# Patient Record
Sex: Male | Born: 1960 | Race: White | Hispanic: No | Marital: Married | Smoking: Never smoker
Health system: Southern US, Community
[De-identification: ages and names within clinical notes are randomized; demographics above are authoritative.]

## PROBLEM LIST (undated history)

## (undated) DIAGNOSIS — R748 Abnormal levels of other serum enzymes: Secondary | ICD-10-CM

## (undated) DIAGNOSIS — R7303 Prediabetes: Secondary | ICD-10-CM

## (undated) DIAGNOSIS — F32A Depression, unspecified: Secondary | ICD-10-CM

## (undated) DIAGNOSIS — R030 Elevated blood-pressure reading, without diagnosis of hypertension: Secondary | ICD-10-CM

## (undated) DIAGNOSIS — M1711 Unilateral primary osteoarthritis, right knee: Secondary | ICD-10-CM

## (undated) HISTORY — PX: WRIST SURGERY: SHX841

## (undated) HISTORY — DX: Unilateral primary osteoarthritis, right knee: M17.11

---

## 2001-08-31 ENCOUNTER — Encounter: Payer: Self-pay | Admitting: Family Medicine

## 2001-08-31 ENCOUNTER — Encounter: Admission: RE | Admit: 2001-08-31 | Discharge: 2001-08-31 | Payer: Self-pay | Admitting: Family Medicine

## 2004-06-15 ENCOUNTER — Encounter: Payer: Self-pay | Admitting: Family Medicine

## 2004-07-07 ENCOUNTER — Encounter: Payer: Self-pay | Admitting: Family Medicine

## 2010-05-20 ENCOUNTER — Ambulatory Visit: Payer: Self-pay | Admitting: Family Medicine

## 2010-12-14 ENCOUNTER — Ambulatory Visit: Payer: Self-pay

## 2010-12-18 ENCOUNTER — Ambulatory Visit: Payer: Self-pay

## 2012-10-17 ENCOUNTER — Ambulatory Visit: Payer: Self-pay | Admitting: Family Medicine

## 2013-07-20 ENCOUNTER — Ambulatory Visit: Payer: Self-pay | Admitting: Gastroenterology

## 2013-07-20 HISTORY — PX: COLONOSCOPY: SHX174

## 2013-07-23 LAB — PATHOLOGY REPORT

## 2015-07-21 ENCOUNTER — Other Ambulatory Visit: Payer: Self-pay | Admitting: Family Medicine

## 2015-08-29 ENCOUNTER — Other Ambulatory Visit: Payer: Self-pay | Admitting: Family Medicine

## 2015-10-08 ENCOUNTER — Ambulatory Visit (INDEPENDENT_AMBULATORY_CARE_PROVIDER_SITE_OTHER): Payer: BLUE CROSS/BLUE SHIELD | Admitting: Family Medicine

## 2015-10-08 ENCOUNTER — Encounter: Payer: Self-pay | Admitting: Family Medicine

## 2015-10-08 VITALS — BP 112/80 | HR 74 | Temp 98.6°F | Resp 16 | Ht 71.75 in | Wt 204.2 lb

## 2015-10-08 DIAGNOSIS — M179 Osteoarthritis of knee, unspecified: Secondary | ICD-10-CM | POA: Insufficient documentation

## 2015-10-08 DIAGNOSIS — B349 Viral infection, unspecified: Secondary | ICD-10-CM | POA: Diagnosis not present

## 2015-10-08 DIAGNOSIS — R509 Fever, unspecified: Secondary | ICD-10-CM

## 2015-10-08 DIAGNOSIS — M1710 Unilateral primary osteoarthritis, unspecified knee: Secondary | ICD-10-CM | POA: Insufficient documentation

## 2015-10-08 DIAGNOSIS — F439 Reaction to severe stress, unspecified: Secondary | ICD-10-CM | POA: Insufficient documentation

## 2015-10-08 LAB — POCT INFLUENZA A/B
Influenza A, POC: NEGATIVE
Influenza B, POC: NEGATIVE

## 2015-10-08 NOTE — Progress Notes (Signed)
Subjective:     Patient ID: Trevor Boyd, male   DOB: 12-22-1960, 55 y.o.   MRN: [id number]  HPI  Chief Complaint  Patient presents with  . Sore Throat    Patient comes in office today with concerns of cold/flu like symptoms since Saturday 1/28.Patient reports the following symptoms; sore throat, cough, fever high of 102-103, headache and vomiting. Patient reports that he has taken Ibuprofen and Nightquil with no relief.   Wife is also sick and has been in to see me with negative flu test. States he was feeling better today with left over head congestion-:my head is in a box"- and he went to work. Took ibuprofen 600 mg.this AM, felt nauseous and vomited.No longer nauseous.   Review of Systems  Psychiatric/Behavioral:       States he went down on sertraline to 50 mg.for two weeks then resumed it as he did not feel right. 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       Objective:   Physical Exam  Constitutional: He appears well-developed and well-nourished. No distress.  Ears: T.M's intact without inflammation Throat: no tonsillar enlargement or exudate Neck: no cervical adenopathy Lungs: clear     Assessment:    1. Fever, unspecified fever cause - POCT Influenza A/B  2. Viral syndrome - POCT Influenza A/B    Plan:    Discussed use of otc medication and fluid intake. Work excuse for 2/1-2/4. Stop ibuprofen.

## 2015-10-08 NOTE — Patient Instructions (Addendum)
Discussed use of Mucinex D for congestion and Tylenol for pain. Encourage fluid intake with Gatorade or similar.

## 2015-10-13 ENCOUNTER — Other Ambulatory Visit: Payer: Self-pay | Admitting: Family Medicine

## 2015-10-13 DIAGNOSIS — F439 Reaction to severe stress, unspecified: Secondary | ICD-10-CM

## 2015-10-13 MED ORDER — SERTRALINE HCL 100 MG PO TABS: 100.0000 mg | ORAL_TABLET | Freq: Every day | ORAL | Status: DC

## 2016-07-07 ENCOUNTER — Other Ambulatory Visit: Payer: Self-pay | Admitting: Family Medicine

## 2016-07-07 DIAGNOSIS — F439 Reaction to severe stress, unspecified: Secondary | ICD-10-CM

## 2016-09-01 ENCOUNTER — Encounter: Payer: Self-pay | Admitting: Physician Assistant

## 2016-09-01 ENCOUNTER — Ambulatory Visit (INDEPENDENT_AMBULATORY_CARE_PROVIDER_SITE_OTHER): Payer: BLUE CROSS/BLUE SHIELD | Admitting: Physician Assistant

## 2016-09-01 VITALS — BP 116/82 | HR 82 | Temp 98.2°F | Resp 14 | Wt 213.0 lb

## 2016-09-01 DIAGNOSIS — S060X0A Concussion without loss of consciousness, initial encounter: Secondary | ICD-10-CM | POA: Diagnosis not present

## 2016-09-01 NOTE — Progress Notes (Signed)
Patient: Trevor Boyd Male    DOB: [birth year]   55 y.o.   MRN: [id number] Visit Date: 09/01/2016  Today's Provider: [name], PA-C   Chief Complaint  Patient presents with  . Head Injury   Subjective:    HPI   Pt is a 55 y/o male not on any anticoagulants who is here for a possible concussion. He reports that about 10:00AM yesterday he was taking a bolt off of something and the wrench came back and hit him in the forehead. He does have a laceration in the area about his left eye and a knot. He did not lose consciousness  but does reports he "saw stars". He did not vomit. He remembers all the events that happened to him. He reports that he has had a 8/10 headache currently that medications are not helping and nausea started a few minutes ago while waiting to be pulled back to the exam room. He also states that his vision is blurry with further distances than normal and that while making a schedule on the computer at work today he had trouble concentrating. Denies dizziness. He takes two ibuprofen daily for his back, which has not helped today with his headache. No prior head injury. Does work at [employer] 20-50 lbs routinely.    No Known Allergies   Current Outpatient Prescriptions:  .  sertraline (ZOLOFT) 100 MG tablet, take 1 tablet by mouth once daily, Disp: 90 tablet, Rfl: 1  Review of Systems  Constitutional: Negative.   HENT: Negative.   Eyes: Positive for visual disturbance.  Respiratory: Negative.   Cardiovascular: Negative.   Gastrointestinal: Positive for nausea.  Endocrine: Negative.   Genitourinary: Negative.   Musculoskeletal: Negative.   Skin: Negative.   Neurological: Positive for headaches.  Hematological: Negative.   Psychiatric/Behavioral: Negative.     Social History  Substance Use Topics  . Smoking status: Never Smoker  . Smokeless tobacco: Never Used  . Alcohol use No   Objective:   BP 116/82 (BP Location: Left Arm, Patient  Position: Sitting, Cuff Size: Normal)   Pulse 82   Temp 98.2 F (36.8 C) (Oral)   Resp 14   Wt 213 lb (96.6 kg)   SpO2 97%   BMI 29.09 kg/m   Physical Exam  Constitutional: He is oriented to person, place, and time. He appears well-developed and well-nourished. No distress.  HENT:  Head: Normocephalic. Head is with laceration. Head is without raccoon's eyes, without Battle's sign, without abrasion, without contusion, without right periorbital erythema and without left periorbital erythema.    Right Ear: Hearing, tympanic membrane and external ear normal.  Left Ear: Hearing, tympanic membrane and external ear normal.  Nose: Nose normal.  Mouth/Throat: Uvula is midline and oropharynx is clear and moist.  1 cm linear healing laceration   Eyes: Conjunctivae and EOM are normal. Pupils are equal, round, and reactive to light.  Neck: Neck supple.  Cardiovascular: Normal rate.   Pulmonary/Chest: Effort normal.  Lymphadenopathy:    He has no cervical adenopathy.  Neurological: He is alert and oriented to person, place, and time. He has normal strength and normal reflexes. He displays normal reflexes. No cranial nerve deficit or sensory deficit. He exhibits normal muscle tone. He displays a negative Romberg sign. He displays no seizure activity. Coordination and gait normal. GCS eye subscore is 4. GCS verbal subscore is 5. GCS motor subscore is 6.  Reflex Scores:  Tricep reflexes are 2+ on the right side and 2+ on the left side.      Bicep reflexes are 2+ on the right side and 2+ on the left side.      Brachioradialis reflexes are 2+ on the right side and 2+ on the left side.      Patellar reflexes are 2+ on the right side and 2+ on the left side.      Achilles reflexes are 2+ on the right side and 2+ on the left side. Skin: Skin is warm and dry. He is not diaphoretic.  Psychiatric: He has a normal mood and affect. His behavior is normal.        Assessment & Plan:     1. Concussion  without loss of consciousness, initial encounter  Counseled patients on concussions and post-concussive syndrome. Counseled patient that he should have complete brain rest for the next 48 hours, have provided work note for this. Counseled patient that at this point and time he does not need head imaging as it has an extremely low likelihood of showing a clinically significant brain injury. Counseled patient to slowly reintroduce activity and on second impact syndrome. Stop taking ibuprofen and try Tyelnol for headache. Counseled on return precautions. Offered anti-emetic but patient declines.  Return in about 1 week (around 09/08/2016) for concussion.  The entirety of the information documented in the History of Present Illness, Review of Systems and Physical Exam were personally obtained by me. Portions of this information were initially documented by [place] and reviewed by me for thoroughness and accuracy.    Patient Instructions  Concussion, Adult A concussion is a brain injury. It is caused by:  A hit to the head.  A quick and sudden movement (jolt) of the head or neck. A concussion is usually not life threatening. Even so, it can cause serious problems. If you had a concussion before, you may have concussion-like problems after a hit to your head. Follow these instructions at home: General instructions  Follow your doctor's directions carefully.  Take medicines only as told by your doctor.  Only take medicines your doctor says are safe.  Do not drink alcohol until your doctor says it is okay. Alcohol and some drugs can slow down healing. They can also put you at risk for further injury.  If you are having trouble remembering things, write them down.  Try to do one thing at a time if you get distracted easily. For example, do not watch TV while making dinner.  Talk to your family members or close friends when making important decisions.  Follow up with your doctor as  told.  Watch your symptoms. Tell others to do the same. Serious problems can sometimes happen after a concussion. Older adults are more likely to have these problems.  Tell your teachers, school nurse, school counselor, coach, Product/process development scientist, or work Freight forwarder about your concussion. Tell them about what you can or cannot do. They should watch to see if:  It gets even harder for you to pay attention or concentrate.  It gets even harder for you to remember things or learn new things.  You need more time than normal to finish things.  You become annoyed (irritable) more than before.  You are not able to deal with stress as well.  You have more problems than before.  Rest. Make sure you:  Get plenty of sleep at night.  Go to sleep early.  Go to bed at the  same time every day. Try to wake up at the same time.  Rest during the day.  Take naps when you feel tired.  Limit activities where you have to think a lot or concentrate. These include:  Doing homework.  Doing work related to a job.  Watching TV.  Using the computer. Returning To Your Regular Activities  Return to your normal activities slowly, not all at once. You must give your body and brain enough time to heal.  Do not play sports or do other athletic activities until your doctor says it is okay.  Ask your doctor when you can drive, ride a bicycle, or work other vehicles or machines. Never do these things if you feel dizzy.  Ask your doctor about when you can return to work or school. Preventing Another Concussion  It is very important to avoid another brain injury, especially before you have healed. In rare cases, another injury can lead to permanent brain damage, brain swelling, or death. The risk of this is greatest during the first 7-10 days after your injury. Avoid injuries by:  Wearing a seat belt when riding in a car.  Not drinking too much alcohol.  Avoiding activities that could lead to a second  concussion (such as contact sports).  Wearing a helmet when doing activities like:  Biking.  Skiing.  Skateboarding.  Skating.  Making your home safer by:  Removing things from the floor or stairways that could make you trip.  Using grab bars in bathrooms and handrails by stairs.  Placing non-slip mats on floors and in bathtubs.  Improve lighting in dark areas. Contact a doctor if:  It gets even harder for you to pay attention or concentrate.  It gets even harder for you to remember things or learn new things.  You need more time than normal to finish things.  You become annoyed (irritable) more than before.  You are not able to deal with stress as well.  You have more problems than before.  You have problems keeping your balance.  You are not able to react quickly when you should. Get help if you have any of these problems for more than 2 weeks:  Lasting (chronic) headaches.  Dizziness or trouble balancing.  Feeling sick to your stomach (nausea).  Seeing (vision) problems.  Being affected by noises or light more than normal.  Feeling sad, low, down in the dumps, blue, gloomy, or empty (depressed).  Mood changes (mood swings).  Feeling of fear or nervousness about what may happen (anxiety).  Feeling annoyed.  Memory problems.  Problems concentrating or paying attention.  Sleep problems.  Feeling tired all the time. Get help right away if:  You have bad headaches or your headaches get worse.  You have weakness (even if it is in one hand, leg, or part of the face).  You have loss of feeling (numbness).  You feel off balance.  You keep throwing up (vomiting).  You feel tired.  One black center of your eye (pupil) is larger than the other.  You twitch or shake violently (convulse).  Your speech is not clear (slurred).  You are more confused, easily angered (agitated), or annoyed than before.  You have more trouble resting than  before.  You are unable to recognize people or places.  You have neck pain.  It is difficult to wake you up.  You have unusual behavior changes.  You pass out (lose consciousness). This information is not intended to replace advice  given to you by your health care provider. Make sure you discuss any questions you have with your health care provider. Document Released: 08/11/2009 Document Revised: 01/29/2016 Document Reviewed: 03/15/2013 Elsevier Interactive Patient Education  2017 [place], [facility]

## 2016-09-01 NOTE — Patient Instructions (Signed)
Concussion, Adult ° °A concussion is a brain injury. It is caused by: °A hit to the head. °A quick and sudden movement (jolt) of the head or neck. °A concussion is usually not life threatening. Even so, it can cause serious problems. If you had a concussion before, you may have concussion-like problems after a hit to your head. °Follow these instructions at home: °General instructions °Follow your doctor's directions carefully. °Take medicines only as told by your doctor. °Only take medicines your doctor says are safe. °Do not drink alcohol until your doctor says it is okay. Alcohol and some drugs can slow down healing. They can also put you at risk for further injury. °If you are having trouble remembering things, write them down. °Try to do one thing at a time if you get distracted easily. For example, do not watch TV while making dinner. °Talk to your family members or close friends when making important decisions. °Follow up with your doctor as told. °Watch your symptoms. Tell others to do the same. Serious problems can sometimes happen after a concussion. Older adults are more likely to have these problems. °Tell your teachers, school nurse, school counselor, coach, athletic trainer, or work manager about your concussion. Tell them about what you can or cannot do. They should watch to see if: °It gets even harder for you to pay attention or concentrate. °It gets even harder for you to remember things or learn new things. °You need more time than normal to finish things. °You become annoyed (irritable) more than before. °You are not able to deal with stress as well. °You have more problems than before. °Rest. Make sure you: °Get plenty of sleep at night. °Go to sleep early. °Go to bed at the same time every day. Try to wake up at the same time. °Rest during the day. °Take naps when you feel tired. °Limit activities where you have to think a lot or concentrate. These include: °Doing homework. °Doing work related  to a job. °Watching TV. °Using the computer. °Returning To Your Regular Activities  °Return to your normal activities slowly, not all at once. You must give your body and brain enough time to heal. °Do not play sports or do other athletic activities until your doctor says it is okay. °Ask your doctor when you can drive, ride a bicycle, or work other vehicles or machines. Never do these things if you feel dizzy. °Ask your doctor about when you can return to work or school. °Preventing Another Concussion  °It is very important to avoid another brain injury, especially before you have healed. In rare cases, another injury can lead to permanent brain damage, brain swelling, or death. The risk of this is greatest during the first 7-10 days after your injury. Avoid injuries by: °Wearing a seat belt when riding in a car. °Not drinking too much alcohol. °Avoiding activities that could lead to a second concussion (such as contact sports). °Wearing a helmet when doing activities like: °Biking. °Skiing. °Skateboarding. °Skating. °Making your home safer by: °Removing things from the floor or stairways that could make you trip. °Using grab bars in bathrooms and handrails by stairs. °Placing non-slip mats on floors and in bathtubs. °Improve lighting in dark areas. °Contact a doctor if: °It gets even harder for you to pay attention or concentrate. °It gets even harder for you to remember things or learn new things. °You need more time than normal to finish things. °You become annoyed (irritable) more than before. °You   are not able to deal with stress as well. °You have more problems than before. °You have problems keeping your balance. °You are not able to react quickly when you should. °Get help if you have any of these problems for more than 2 weeks: °Lasting (chronic) headaches. °Dizziness or trouble balancing. °Feeling sick to your stomach (nausea). °Seeing (vision) problems. °Being affected by noises or light more than  normal. °Feeling sad, low, down in the dumps, blue, gloomy, or empty (depressed). °Mood changes (mood swings). °Feeling of fear or nervousness about what may happen (anxiety). °Feeling annoyed. °Memory problems. °Problems concentrating or paying attention. °Sleep problems. °Feeling tired all the time. °Get help right away if: °You have bad headaches or your headaches get worse. °You have weakness (even if it is in one hand, leg, or part of the face). °You have loss of feeling (numbness). °You feel off balance. °You keep throwing up (vomiting). °You feel tired. °One black center of your eye (pupil) is larger than the other. °You twitch or shake violently (convulse). °Your speech is not clear (slurred). °You are more confused, easily angered (agitated), or annoyed than before. °You have more trouble resting than before. °You are unable to recognize people or places. °You have neck pain. °It is difficult to wake you up. °You have unusual behavior changes. °You pass out (lose consciousness). °This information is not intended to replace advice given to you by your health care provider. Make sure you discuss any questions you have with your health care provider. °Document Released: 08/11/2009 Document Revised: 01/29/2016 Document Reviewed: 03/15/2013 °Elsevier Interactive Patient Education © 2017 Elsevier Inc. °  °

## 2016-09-08 ENCOUNTER — Ambulatory Visit: Payer: BLUE CROSS/BLUE SHIELD | Admitting: Physician Assistant

## 2017-03-04 ENCOUNTER — Other Ambulatory Visit: Payer: Self-pay | Admitting: Family Medicine

## 2017-03-04 DIAGNOSIS — F439 Reaction to severe stress, unspecified: Secondary | ICD-10-CM

## 2017-06-24 ENCOUNTER — Telehealth: Payer: Self-pay | Admitting: Family Medicine

## 2017-06-24 ENCOUNTER — Other Ambulatory Visit: Payer: Self-pay | Admitting: Family Medicine

## 2017-06-24 DIAGNOSIS — F439 Reaction to severe stress, unspecified: Secondary | ICD-10-CM

## 2017-06-24 MED ORDER — SERTRALINE HCL 100 MG PO TABS: 100.0000 mg | ORAL_TABLET | Freq: Every day | ORAL | 0 refills | Status: DC

## 2017-06-24 NOTE — Telephone Encounter (Signed)
Pt contacted office for refill request on the following medications:  sertraline (ZOLOFT) 100 MG tablet  [place],  [phone]

## 2017-08-08 ENCOUNTER — Encounter: Payer: Self-pay | Admitting: Family Medicine

## 2017-08-08 ENCOUNTER — Ambulatory Visit (INDEPENDENT_AMBULATORY_CARE_PROVIDER_SITE_OTHER): Payer: BLUE CROSS/BLUE SHIELD | Admitting: Family Medicine

## 2017-08-08 VITALS — BP 130/92 | HR 83 | Temp 98.0°F | Resp 16 | Wt 212.8 lb

## 2017-08-08 DIAGNOSIS — F439 Reaction to severe stress, unspecified: Secondary | ICD-10-CM | POA: Diagnosis not present

## 2017-08-08 DIAGNOSIS — J069 Acute upper respiratory infection, unspecified: Secondary | ICD-10-CM | POA: Diagnosis not present

## 2017-08-08 MED ORDER — HYDROCODONE-HOMATROPINE 5-1.5 MG/5ML PO SYRP: ORAL_SOLUTION | ORAL | 0 refills | Status: DC

## 2017-08-08 NOTE — Progress Notes (Addendum)
Subjective:     Patient ID: Trevor Boyd, male   DOB: [birth year], 56 y.o.   MRN: [id number] Chief Complaint  Patient presents with  . Cough    Patient comes in office today with complaints of cough and congestion since 08/03/17. Patient reports non productive cough, runnys nose, sensitivity to light, headache, sinus pain and wheezing. Patient has tried otc Mucinex, Ibuprofen and Nyquil.    HPI States his sons have been sick as well. 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  Review of Systems     Objective:   Physical Exam  Constitutional: He appears well-developed and well-nourished. No distress.  Ears: T.M's intact without inflammation Throat: no tonsillar enlargement or exudate Neck: no cervical adenopathy Lungs: expiratory wheezes in bilateral lung fields..    Assessment:    1. Viral upper respiratory tract infection - HYDROcodone-homatropine (HYCODAN) 5-1.5 MG/5ML syrup; 5 ml 4-6 hours as needed for cough  Dispense: 120 mL; Refill: 0  2. Situational stress    Plan:   Discussed use of Mucinex D. Suggested slow sertraline taper after the holidays.

## 2017-08-08 NOTE — Patient Instructions (Signed)
Discussed use of Mucinex D. Consider slowly tapering sertraline after the holidays. I am willing to call in the 50 mg.if needed.

## 2017-08-10 ENCOUNTER — Encounter: Payer: Self-pay | Admitting: Physician Assistant

## 2017-08-10 ENCOUNTER — Ambulatory Visit
Admission: RE | Admit: 2017-08-10 | Discharge: 2017-08-10 | Disposition: A | Discharge status: Home / Self Care | Payer: BLUE CROSS/BLUE SHIELD | Source: Ambulatory Visit | Attending: Physician Assistant | Admitting: Physician Assistant

## 2017-08-10 ENCOUNTER — Ambulatory Visit (INDEPENDENT_AMBULATORY_CARE_PROVIDER_SITE_OTHER): Payer: BLUE CROSS/BLUE SHIELD | Admitting: Physician Assistant

## 2017-08-10 VITALS — BP 108/70 | HR 76 | Temp 97.9°F | Resp 16 | Wt 207.0 lb

## 2017-08-10 DIAGNOSIS — R059 Cough, unspecified: Secondary | ICD-10-CM

## 2017-08-10 DIAGNOSIS — R05 Cough: Secondary | ICD-10-CM | POA: Diagnosis not present

## 2017-08-10 DIAGNOSIS — R0989 Other specified symptoms and signs involving the circulatory and respiratory systems: Secondary | ICD-10-CM

## 2017-08-10 DIAGNOSIS — R062 Wheezing: Secondary | ICD-10-CM

## 2017-08-10 MED ORDER — ALBUTEROL SULFATE HFA 108 (90 BASE) MCG/ACT IN AERS: 2.0000 | INHALATION_SPRAY | Freq: Four times a day (QID) | RESPIRATORY_TRACT | 2 refills | Status: DC | PRN

## 2017-08-10 NOTE — Patient Instructions (Signed)

## 2017-08-10 NOTE — Progress Notes (Signed)
[place]  Chief Complaint  Patient presents with  . URI    Follow from yesterday    Subjective:    Patient ID: Trevor Boyd, male    DOB: 01/18/1961, 56 y.o.   MRN: [id number]  Upper Respiratory Infection: Trevor Boyd is a  56 y.o. male symptoms of a URI, possible sinusitis. Symptoms include congestion, cough and plugged sensation in the right ear. Onset of symptoms was 8 days ago, gradually worsening since that time. He also c/o cough, headache for the past 3 days .  He is drinking plenty of fluids. Evaluation to date: seen previously and thought to have a viral URI. Treatment to date: cough suppressants. The treatment has provided minimal relief. He was seen by Mikki Santee 2 days ago and given mucinex and cough syrup. He feels worse today, wheezing, SOB.  Review of Systems  Constitutional: Positive for diaphoresis and fatigue. Negative for activity change, appetite change, chills, fever and unexpected weight change.  HENT: Positive for congestion, sinus pressure and sinus pain. Negative for ear discharge, ear pain, hearing loss, nosebleeds, postnasal drip, rhinorrhea, sneezing, sore throat, tinnitus and trouble swallowing.   Eyes: Negative.   Respiratory: Positive for cough, chest tightness, shortness of breath and wheezing. Negative for apnea, choking and stridor.   Gastrointestinal: Negative.   Musculoskeletal: Negative for neck pain and neck stiffness.  Neurological: Positive for light-headedness and headaches. Negative for dizziness.       Objective:   BP 108/70 (BP Location: Left Arm, Patient Position: Sitting, Cuff Size: Large)   Pulse 76   Temp 97.9 F (36.6 C) (Oral)   Resp 16   Wt 207 lb (93.9 kg)   SpO2 94%   BMI 28.27 kg/m   Patient Active Problem List   Diagnosis Date Noted  . Arthritis of knee, degenerative 10/08/2015  . Situational stress 10/08/2015    Outpatient Encounter Medications as of 08/10/2017  Medication Sig  .  HYDROcodone-homatropine (HYCODAN) 5-1.5 MG/5ML syrup 5 ml 4-6 hours as needed for cough  . sertraline (ZOLOFT) 100 MG tablet Take 1 tablet (100 mg total) by mouth daily.  Marland Kitchen albuterol (PROVENTIL HFA;VENTOLIN HFA) 108 (90 Base) MCG/ACT inhaler Inhale 2 puffs into the lungs every 6 (six) hours as needed for wheezing or shortness of breath.   No facility-administered encounter medications on file as of 08/10/2017.     No Known Allergies     Physical Exam  Constitutional: He is oriented to person, place, and time. He appears well-developed and well-nourished.  Cardiovascular: Normal rate and regular rhythm.  Pulmonary/Chest: Effort normal. He has wheezes.  Diffuse wheezes and rhonchi, slightly increased in RLL.  Neurological: He is alert and oriented to person, place, and time.  Skin: Skin is warm and dry.  Psychiatric: He has a normal mood and affect. His behavior is normal.       Assessment & Plan:  1. Wheezes  Oxygen between 93-94%, diffuse wheezing. Concerned for bronchitis vs. Pneumonia. Will get CXR. Send for inhaler, will add steroids if bronchitis, antibiotics for pneumonia.  - DG Chest 2 View; Future - albuterol (PROVENTIL HFA;VENTOLIN HFA) 108 (90 Base) MCG/ACT inhaler; Inhale 2 puffs into the lungs every 6 (six) hours as needed for wheezing or shortness of breath.  Dispense: 1 Inhaler; Refill: 2  2. Rhonchi  - DG Chest 2 View; Future  3. Cough  - DG Chest 2 View; Future  Return if symptoms worsen or fail to improve.  The  entirety of the information documented in the History of Present Illness, Review of Systems and Physical Exam were personally obtained by me. Portions of this information were initially documented by Ashley Royalty, CMA and reviewed by me for thoroughness and accuracy.

## 2017-08-11 ENCOUNTER — Other Ambulatory Visit: Payer: Self-pay | Admitting: Physician Assistant

## 2017-08-11 ENCOUNTER — Telehealth: Payer: Self-pay

## 2017-08-11 DIAGNOSIS — J40 Bronchitis, not specified as acute or chronic: Secondary | ICD-10-CM

## 2017-08-11 MED ORDER — PREDNISONE 10 MG (21) PO TBPK: ORAL_TABLET | ORAL | 0 refills | Status: DC

## 2017-08-11 NOTE — Telephone Encounter (Signed)
-----   Message from [name], [place] sent at 08/11/2017  8:32 AM EST ----- CXR did not show pneumonia. I will send in prednisone taper for patient for bronchitis. This can cause some sleeplessness during the higher dose. Antibiotics not necessary right now.

## 2017-08-11 NOTE — Telephone Encounter (Signed)
Pt advised.   Thanks,   -Laura  

## 2017-08-11 NOTE — Progress Notes (Signed)
Prednisone taper for bronchitis.

## 2017-10-07 ENCOUNTER — Ambulatory Visit (INDEPENDENT_AMBULATORY_CARE_PROVIDER_SITE_OTHER): Payer: BLUE CROSS/BLUE SHIELD | Admitting: Physician Assistant

## 2017-10-07 ENCOUNTER — Encounter: Payer: Self-pay | Admitting: Physician Assistant

## 2017-10-07 VITALS — BP 126/84 | HR 80 | Temp 98.3°F | Resp 16 | Wt 214.0 lb

## 2017-10-07 DIAGNOSIS — S39012S Strain of muscle, fascia and tendon of lower back, sequela: Secondary | ICD-10-CM

## 2017-10-07 DIAGNOSIS — M545 Low back pain, unspecified: Secondary | ICD-10-CM

## 2017-10-07 MED ORDER — MELOXICAM 15 MG PO TABS: 15.0000 mg | ORAL_TABLET | Freq: Every day | ORAL | 0 refills | Status: DC

## 2017-10-07 MED ORDER — CYCLOBENZAPRINE HCL 5 MG PO TABS: 5.0000 mg | ORAL_TABLET | Freq: Every day | ORAL | 0 refills | Status: DC

## 2017-10-07 NOTE — Progress Notes (Signed)
Patient: Trevor Boyd Male    DOB: November 25, 1960   57 y.o.   MRN: [id number] Visit Date: 10/07/2017  Today's Provider: [name], PA-C   Chief Complaint  Patient presents with  . Back Pain    Started about four days ago.   Subjective:    Trevor Boyd is a 57 y/o man presenting with Low back Pain ongoing for four days. He rates the pain as moderate. He reports no injuries, falls or surgeries. He has no weakness, numbness, incontinence. No abdominal pain. No dysuria. No constipaton or diarrhea. He reports he has been sitting a lot frequently. He has tried 800 mg ibuprofen BID today without relief.  Back Pain  This is a new problem. The current episode started in the past 7 days. The problem has been gradually worsening since onset. The pain is present in the lumbar spine. The quality of the pain is described as burning (sharpe ). The pain radiates to the right thigh. The pain is the same all the time. The symptoms are aggravated by sitting, standing and position. Stiffness is present all day.       No Known Allergies   Current Outpatient Medications:  .  sertraline (ZOLOFT) 100 MG tablet, Take 1 tablet (100 mg total) by mouth daily., Disp: 90 tablet, Rfl: 0 .  albuterol (PROVENTIL HFA;VENTOLIN HFA) 108 (90 Base) MCG/ACT inhaler, Inhale 2 puffs into the lungs every 6 (six) hours as needed for wheezing or shortness of breath., Disp: 1 Inhaler, Rfl: 2 .  HYDROcodone-homatropine (HYCODAN) 5-1.5 MG/5ML syrup, 5 ml 4-6 hours as needed for cough, Disp: 120 mL, Rfl: 0  Review of Systems  Constitutional: Negative.   Gastrointestinal: Negative.   Genitourinary: Negative.   Musculoskeletal: Positive for back pain and myalgias (Right arm pain for about a week). Negative for arthralgias, gait problem, joint swelling, neck pain and neck stiffness.    Social History   Tobacco Use  . Smoking status: Never Smoker  . Smokeless tobacco: Never Used  Substance Use Topics  . Alcohol use:  No    Alcohol/week: 0.0 oz   Objective:   BP 126/84 (BP Location: Right Arm, Patient Position: Sitting, Cuff Size: Normal)   Pulse 80   Temp 98.3 F (36.8 C) (Oral)   Resp 16   Wt 214 lb (97.1 kg)   BMI 29.23 kg/m  Vitals:   10/07/17 1537  BP: 126/84  Pulse: 80  Resp: 16  Temp: 98.3 F (36.8 C)  TempSrc: Oral  Weight: 214 lb (97.1 kg)     Physical Exam  Constitutional: He is oriented to person, place, and time. He appears well-developed and well-nourished.  Cardiovascular: Normal rate.  Pulmonary/Chest: Effort normal.  Musculoskeletal: Normal range of motion. He exhibits no edema, tenderness or deformity.       Cervical back: Normal.       Thoracic back: Normal.       Lumbar back: Normal.  Some tightness in paraspinal muscles, pain over SI joints.   Neurological: He is alert and oriented to person, place, and time. He has normal reflexes.  Reflex Scores:      Patellar reflexes are 2+ on the right side and 2+ on the left side.      Achilles reflexes are 2+ on the right side and 2+ on the left side. Psychiatric: He has a normal mood and affect. His behavior is normal.        Assessment &  Plan:     1. Bilateral low back pain without sciatica, unspecified chronicity  - cyclobenzaprine (FLEXERIL) 5 MG tablet; Take 1 tablet (5 mg total) by mouth at bedtime.  Dispense: 30 tablet; Refill: 0 - meloxicam (MOBIC) 15 MG tablet; Take 1 tablet (15 mg total) by mouth daily.  Dispense: 30 tablet; Refill: 0  2. Back strain, sequela  - cyclobenzaprine (FLEXERIL) 5 MG tablet; Take 1 tablet (5 mg total) by mouth at bedtime.  Dispense: 30 tablet; Refill: 0 - meloxicam (MOBIC) 15 MG tablet; Take 1 tablet (15 mg total) by mouth daily.  Dispense: 30 tablet; Refill: 0  Return if symptoms worsen or fail to improve.  The entirety of the information documented in the History of Present Illness, Review of Systems and Physical Exam were personally obtained by me. Portions of this information  were initially documented by Ashley Royalty, CMA and reviewed by me for thoroughness and accuracy.        [name], PA-C  [facility]

## 2017-10-07 NOTE — Patient Instructions (Signed)

## 2017-10-31 ENCOUNTER — Telehealth: Payer: Self-pay | Admitting: Family Medicine

## 2017-10-31 NOTE — Telephone Encounter (Signed)
Call him and ask him about what he is doing about tapering down.

## 2017-10-31 NOTE — Telephone Encounter (Signed)
Please review, after reading last visit 08/08/17 you had mentioned in note about patient tapering down off medication. KW

## 2017-10-31 NOTE — Telephone Encounter (Signed)
Pt contacted office for refill request on the following medications:  sertraline (ZOLOFT) 100 MG tablet  [facility]  90 day supply  Last Rx: 06/24/17 LOV: 08/08/17 (saw Adriana 10/07/17) Please advise. Thanks TNP

## 2017-11-01 ENCOUNTER — Other Ambulatory Visit: Payer: Self-pay | Admitting: Family Medicine

## 2017-11-01 DIAGNOSIS — F439 Reaction to severe stress, unspecified: Secondary | ICD-10-CM

## 2017-11-01 MED ORDER — SERTRALINE HCL 100 MG PO TABS: 100.0000 mg | ORAL_TABLET | Freq: Every day | ORAL | 1 refills | Status: DC

## 2017-11-01 NOTE — Telephone Encounter (Signed)
Spoke with patient on the phone who states that has time he was seen by you in office he had mentioned that he was staying on the medication and would not be tapering of. Patient reports good compliance and symptom control on medication. KW

## 2017-11-01 NOTE — Telephone Encounter (Signed)
I refilled sertraline to [facility].

## 2017-11-03 ENCOUNTER — Other Ambulatory Visit: Payer: Self-pay | Admitting: Physician Assistant

## 2017-11-03 DIAGNOSIS — M545 Low back pain, unspecified: Secondary | ICD-10-CM

## 2017-11-03 DIAGNOSIS — S39012S Strain of muscle, fascia and tendon of lower back, sequela: Secondary | ICD-10-CM

## 2018-04-12 ENCOUNTER — Telehealth: Payer: Self-pay | Admitting: Family Medicine

## 2018-04-12 NOTE — Telephone Encounter (Signed)
Pt needs new refill on his sertaline 100 mg  [facility]

## 2018-04-13 ENCOUNTER — Other Ambulatory Visit: Payer: Self-pay | Admitting: Family Medicine

## 2018-04-13 DIAGNOSIS — F439 Reaction to severe stress, unspecified: Secondary | ICD-10-CM

## 2018-04-13 MED ORDER — SERTRALINE HCL 100 MG PO TABS: 100.0000 mg | ORAL_TABLET | Freq: Every day | ORAL | 1 refills | Status: DC

## 2018-04-13 NOTE — Telephone Encounter (Signed)
refilled 

## 2018-04-13 NOTE — Telephone Encounter (Signed)
Last office visit 08/08/17, patient is due for follow up. Prescription was last filled 11/01/17 please advise. KW

## 2018-05-13 ENCOUNTER — Encounter: Payer: Self-pay | Admitting: Family Medicine

## 2018-05-13 ENCOUNTER — Ambulatory Visit (INDEPENDENT_AMBULATORY_CARE_PROVIDER_SITE_OTHER): Payer: BLUE CROSS/BLUE SHIELD | Admitting: Family Medicine

## 2018-05-13 VITALS — BP 120/90 | HR 72 | Temp 98.4°F | Resp 16 | Wt 211.0 lb

## 2018-05-13 DIAGNOSIS — M722 Plantar fascial fibromatosis: Secondary | ICD-10-CM | POA: Diagnosis not present

## 2018-05-13 DIAGNOSIS — L300 Nummular dermatitis: Secondary | ICD-10-CM

## 2018-05-13 MED ORDER — TRIAMCINOLONE ACETONIDE 0.5 % EX OINT: 1.0000 "application " | TOPICAL_OINTMENT | Freq: Two times a day (BID) | CUTANEOUS | 0 refills | Status: DC

## 2018-05-13 NOTE — Patient Instructions (Signed)

## 2018-05-13 NOTE — Progress Notes (Signed)
Patient: Trevor Boyd Male    DOB: March 07, 1961   57 y.o.   MRN: [id number] Visit Date: 05/13/2018  Today's Provider: Lavon Paganini, MD   Chief Complaint  Patient presents with  . Rash   Subjective:    HPI Rash: Patient complains of rash involving the left ankle. Rash started 4 days ago. Appearance of rash at onset: Color of lesion(s): pink, initially red. Rash has not changed over time Initial distribution: left ankle.  Discomfort associated with rash: causes no discomfort.  Associated symptoms: none. Denies: none. Patient has not had previous evaluation of rash. Patient has not had previous treatment. Patient has not had contacts with similar rash. Patient has not identified precipitant. Patient has not had new exposures (soaps, lotions, laundry detergents, foods, medications, plants, insects or animals.)  He does do yard work for a living.  Patient C/O pain around L ankle x's several months. Patient denies any injuries, reports some swelling. Patient reports pain was worse last night. Patient reports taking Ibuprofen 800 mg daily, reports no pain control.  Pain is on the medial midfoot and underneath his foot.  He does not remember any injury or trauma.  He woke up one morning and his foot was hurting and hard to walk on.  He states his pain is like his previous episode of plantar fasciitis.  He is a little confused about it though as it was previously in both feet and now is only in his left foot.    No Known Allergies   Current Outpatient Medications:  .  ibuprofen (ADVIL,MOTRIN) 200 MG tablet, Take 800 mg by mouth every 8 (eight) hours as needed., Disp: , Rfl:  .  sertraline (ZOLOFT) 100 MG tablet, Take 1 tablet (100 mg total) by mouth daily., Disp: 90 tablet, Rfl: 1  Review of Systems  Constitutional: Negative.   HENT: Negative.   Respiratory: Negative.   Cardiovascular: Negative.   Musculoskeletal: Positive for myalgias.  Skin: Positive for rash.    Social History     Tobacco Use  . Smoking status: Never Smoker  . Smokeless tobacco: Never Used  Substance Use Topics  . Alcohol use: No    Alcohol/week: 0.0 standard drinks   Objective:   BP 120/90 (BP Location: Right Arm, Patient Position: Sitting, Cuff Size: Large)   Pulse 72   Temp 98.4 F (36.9 C) (Oral)   Resp 16   Wt 211 lb (95.7 kg)   SpO2 97%   BMI 28.82 kg/m  Vitals:   05/13/18 0935  BP: 120/90  Pulse: 72  Resp: 16  Temp: 98.4 F (36.9 C)  TempSrc: Oral  SpO2: 97%  Weight: 211 lb (95.7 kg)     Physical Exam  Constitutional: He is oriented to person, place, and time. He appears well-developed and well-nourished. No distress.  HENT:  Head: Normocephalic and atraumatic.  Eyes: Conjunctivae are normal. No scleral icterus.  Cardiovascular: Normal rate, regular rhythm, normal heart sounds and intact distal pulses.  No murmur heard. Pulmonary/Chest: Effort normal and breath sounds normal. No respiratory distress. He has no wheezes. He has no rales.  Musculoskeletal: He exhibits no edema or deformity.  Tenderness to palpation along the mid plantar fascia up into the medial midfoot of his left foot.  Ankle range of motion is intact.  No tenderness palpation over medial or lateral malleolus.  No tenderness palpation over the Achilles tendon, posterior tibial tendon, or peroneal tendon.  Neurological: He is alert and  oriented to person, place, and time.  Skin: Skin is warm and dry. Capillary refill takes less than 2 seconds.  3 round erythematous plaques that are dry on left ankle over the navicular bone.  Rash is not tender to palpation.  There is no surrounding erythema or signs of infection.  Psychiatric: He has a normal mood and affect. His behavior is normal.  Vitals reviewed.       Assessment & Plan:   1. Plantar fasciitis of left foot Tenderness to palpation along midfoot is consistent with plantar fasciitis -Discussed with patient that this can recur quite frequently and  it can be in one or both feet -Reassured him that there is no sign of fracture and that pain from fracture would not last as long as this has -Continue to use arch support and supportive shoes - Home exercise program given - Discussed icing and rolling a bottle or ball underneath his foot -Discussed heel lifts and drops as well to ensure that Achilles tendon is not tight and contributing to the problem  2. Nummular eczema -Erythematous plaques on on ankle seem most consistent with nummular eczema -Do not appear to be fungal in nature -Suspect that the use occurred from rubbing of his shoe while he was working - No signs of infection - Treat with triamcinolone ointment twice daily until resolution -Discussed return precautions    Meds ordered this encounter  Medications  . triamcinolone ointment (KENALOG) 0.5 %    Sig: Apply 1 application topically 2 (two) times daily.    Dispense:  30 g    Refill:  0     Return if symptoms worsen or fail to improve.   The entirety of the information documented in the History of Present Illness, Review of Systems and Physical Exam were personally obtained by me. Portions of this information were initially documented by Lendon Ka, CMA and reviewed by me for thoroughness and accuracy.    Virginia Crews, MD, MPH [facility] 05/13/2018 9:55 AM

## 2018-05-15 ENCOUNTER — Ambulatory Visit: Payer: Self-pay | Admitting: Family Medicine

## 2018-06-16 ENCOUNTER — Other Ambulatory Visit: Payer: Self-pay | Admitting: Family Medicine

## 2018-06-16 DIAGNOSIS — F439 Reaction to severe stress, unspecified: Secondary | ICD-10-CM

## 2019-01-08 ENCOUNTER — Telehealth: Payer: Self-pay | Admitting: Family Medicine

## 2019-01-08 DIAGNOSIS — F439 Reaction to severe stress, unspecified: Secondary | ICD-10-CM

## 2019-01-08 NOTE — Telephone Encounter (Signed)
[facility] fax[facility] refill request for the following medications:  sertraline (ZOLOFT) 100 MG tablet   Please advise.  

## 2019-01-10 NOTE — Telephone Encounter (Signed)
Please set evisit for f/u of Zoloft and to establish with new provider.  Ok to give 1 month supply to hold him over to appt if needed, but can see him this week

## 2019-01-10 NOTE — Telephone Encounter (Signed)
LMTCB to schedule a e-visit with a new provider to follow up for medication refills.

## 2019-01-12 NOTE — Telephone Encounter (Signed)
Can we try to call him again

## 2019-01-17 NOTE — Telephone Encounter (Signed)
Seems he will be seeing you tomorrow.

## 2019-01-17 NOTE — Telephone Encounter (Signed)
Patient scheduled with Adriana on 01/18/2019

## 2019-01-18 ENCOUNTER — Ambulatory Visit (INDEPENDENT_AMBULATORY_CARE_PROVIDER_SITE_OTHER): Payer: BLUE CROSS/BLUE SHIELD | Admitting: Physician Assistant

## 2019-01-18 ENCOUNTER — Encounter: Payer: Self-pay | Admitting: Physician Assistant

## 2019-01-18 ENCOUNTER — Other Ambulatory Visit: Payer: Self-pay

## 2019-01-18 VITALS — BP 130/80 | Temp 98.8°F | Wt 202.0 lb

## 2019-01-18 DIAGNOSIS — Z1329 Encounter for screening for other suspected endocrine disorder: Secondary | ICD-10-CM

## 2019-01-18 DIAGNOSIS — Z13220 Encounter for screening for lipoid disorders: Secondary | ICD-10-CM

## 2019-01-18 DIAGNOSIS — Z23 Encounter for immunization: Secondary | ICD-10-CM | POA: Diagnosis not present

## 2019-01-18 DIAGNOSIS — Z130 Encounter for screening for diseases of the blood and blood-forming organs and certain disorders involving the immune mechanism: Secondary | ICD-10-CM

## 2019-01-18 DIAGNOSIS — Z125 Encounter for screening for malignant neoplasm of prostate: Secondary | ICD-10-CM

## 2019-01-18 DIAGNOSIS — Z0000 Encounter for general adult medical examination without abnormal findings: Secondary | ICD-10-CM

## 2019-01-18 DIAGNOSIS — Z114 Encounter for screening for human immunodeficiency virus [HIV]: Secondary | ICD-10-CM | POA: Diagnosis not present

## 2019-01-18 DIAGNOSIS — Z1159 Encounter for screening for other viral diseases: Secondary | ICD-10-CM | POA: Diagnosis not present

## 2019-01-18 DIAGNOSIS — F439 Reaction to severe stress, unspecified: Secondary | ICD-10-CM

## 2019-01-18 DIAGNOSIS — Z131 Encounter for screening for diabetes mellitus: Secondary | ICD-10-CM

## 2019-01-18 MED ORDER — SERTRALINE HCL 100 MG PO TABS: 100.0000 mg | ORAL_TABLET | Freq: Every day | ORAL | 3 refills | Status: DC

## 2019-01-18 NOTE — Patient Instructions (Signed)
Health Maintenance After Age 58 After age 58, you are at a higher risk for certain long-term diseases and infections as well as injuries from falls. Falls are a major cause of broken bones and head injuries in people who are older than age 58. Getting regular preventive care can help to keep you healthy and well. Preventive care includes getting regular testing and making lifestyle changes as recommended by your health care provider. Talk with your health care provider about:  Which screenings and tests you should have. A screening is a test that checks for a disease when you have no symptoms.  A diet and exercise plan that is right for you. What should I know about screenings and tests to prevent falls? Screening and testing are the best ways to find a health problem early. Early diagnosis and treatment give you the best chance of managing medical conditions that are common after age 58. Certain conditions and lifestyle choices may make you more likely to have a fall. Your health care provider may recommend:  Regular vision checks. Poor vision and conditions such as cataracts can make you more likely to have a fall. If you wear glasses, make sure to get your prescription updated if your vision changes.  Medicine review. Work with your health care provider to regularly review all of the medicines you are taking, including over-the-counter medicines. Ask your health care provider about any side effects that may make you more likely to have a fall. Tell your health care provider if any medicines that you take make you feel dizzy or sleepy.  Osteoporosis screening. Osteoporosis is a condition that causes the bones to get weaker. This can make the bones weak and cause them to break more easily.  Blood pressure screening. Blood pressure changes and medicines to control blood pressure can make you feel dizzy.  Strength and balance checks. Your health care provider may recommend certain tests to check your  strength and balance while standing, walking, or changing positions.  Foot health exam. Foot pain and numbness, as well as not wearing proper footwear, can make you more likely to have a fall.  Depression screening. You may be more likely to have a fall if you have a fear of falling, feel emotionally low, or feel unable to do activities that you used to do.  Alcohol use screening. Using too much alcohol can affect your balance and may make you more likely to have a fall. What actions can I take to lower my risk of falls? General instructions  Talk with your health care provider about your risks for falling. Tell your health care provider if: ? You fall. Be sure to tell your health care provider about all falls, even ones that seem minor. ? You feel dizzy, sleepy, or off-balance.  Take over-the-counter and prescription medicines only as told by your health care provider. These include any supplements.  Eat a healthy diet and maintain a healthy weight. A healthy diet includes low-fat dairy products, low-fat (lean) meats, and fiber from whole grains, beans, and lots of fruits and vegetables. Home safety  Remove any tripping hazards, such as rugs, cords, and clutter.  Install safety equipment such as grab bars in bathrooms and safety rails on stairs.  Keep rooms and walkways well-lit. Activity   Follow a regular exercise program to stay fit. This will help you maintain your balance. Ask your health care provider what types of exercise are appropriate for you.  If you need a cane or   walker, use it as recommended by your health care provider.  Wear supportive shoes that have nonskid soles. Lifestyle  Do not drink alcohol if your health care provider tells you not to drink.  If you drink alcohol, limit how much you have: ? 0-1 drink a day for women. ? 0-2 drinks a day for men.  Be aware of how much alcohol is in your drink. In the [place] one drink equals one typical bottle of beer (12  oz), one-half glass of wine (5 oz), or one shot of hard liquor (1 oz).  Do not use any products that contain nicotine or tobacco, such as cigarettes and e-cigarettes. If you need help quitting, ask your health care provider. Summary  Having a healthy lifestyle and getting preventive care can help to protect your health and wellness after age 58.  Screening and testing are the best way to find a health problem early and help you avoid having a fall. Early diagnosis and treatment give you the best chance for managing medical conditions that are more common for people who are older than age 58.  Falls are a major cause of broken bones and head injuries in people who are older than age 58. Take precautions to prevent a fall at home.  Work with your health care provider to learn what changes you can make to improve your health and wellness and to prevent falls. This information is not intended to replace advice given to you by your health care provider. Make sure you discuss any questions you have with your health care provider. Document Released: 07/06/2017 Document Revised: 07/06/2017 Document Reviewed: 07/06/2017 Elsevier Interactive Patient Education  2019 Elsevier Inc.  

## 2019-01-18 NOTE — Progress Notes (Signed)
Patient: Trevor Boyd Male    DOB: 1961-02-19   58 y.o.   MRN: [id number] Visit Date: 01/18/2019  Today's Provider: [name], PA-C   Chief Complaint  Patient presents with  . Follow-up   Subjective:     Previously was seeing Mariel Sleet, PA-C who has since retired. Patient presenting today for CPE and follow up.   HPI   Lives in [place] with wife and two boys age 30 and 31. Radio broadcast assistant at [employer].  Patient presents today for medication refill and to meet and greet new provider. Patient states he takes the Sertraline for stress, takes it 100 mg daily and has so for years. Patient denies any side effects that this moment. Patient reports good compliance with medication.  Reports he had colonoscopy in 2014 through [place], records will need to be requested.   Does not recall having prostate cancer screening. Denies family history of prostate cancer.   No Known Allergies   Current Outpatient Medications:  .  ibuprofen (ADVIL,MOTRIN) 200 MG tablet, Take 800 mg by mouth every 8 (eight) hours as needed., Disp: , Rfl:  .  sertraline (ZOLOFT) 100 MG tablet, Take 1 tablet (100 mg total) by mouth daily., Disp: 90 tablet, Rfl: 1 .  sertraline (ZOLOFT) 100 MG tablet, TAKE 1 TABLET BY MOUTH DAILY, Disp: 90 tablet, Rfl: 0 .  triamcinolone ointment (KENALOG) 0.5 %, Apply 1 application topically 2 (two) times daily., Disp: 30 g, Rfl: 0  Review of Systems  Constitutional: Negative.   Respiratory: Negative.   Genitourinary: Negative.   Psychiatric/Behavioral: Negative.     Social History   Tobacco Use  . Smoking status: Never Smoker  . Smokeless tobacco: Never Used  Substance Use Topics  . Alcohol use: No    Alcohol/week: 0.0 standard drinks      Objective:   There were no vitals taken for this visit. There were no vitals filed for this visit.   Physical Exam Constitutional:      Appearance: Normal appearance.  HENT:     Right Ear: Tympanic membrane  and ear canal normal.     Left Ear: Tympanic membrane and ear canal normal.  Cardiovascular:     Rate and Rhythm: Normal rate and regular rhythm.     Heart sounds: Normal heart sounds.  Pulmonary:     Effort: Pulmonary effort is normal.     Breath sounds: Normal breath sounds.  Abdominal:     General: Abdomen is flat. Bowel sounds are normal.     Palpations: Abdomen is soft.  Skin:    General: Skin is warm and dry.  Neurological:     Mental Status: He is alert and oriented to person, place, and time. Mental status is at baseline.  Psychiatric:        Mood and Affect: Mood normal.        Behavior: Behavior normal.         Assessment & Plan    1. Annual physical exam   2. Situational stress  Stable, continue. - sertraline (ZOLOFT) 100 MG tablet; Take 1 tablet (100 mg total) by mouth daily.  Dispense: 90 tablet; Refill: 3  3. Encounter for screening for HIV  - HIV antibody (with reflex)  4. Encounter for hepatitis C screening test for low risk patient  - Hepatitis C antibody  5. Screening for deficiency anemia  - CBC with Differential  6. Screening cholesterol level  - Lipid Profile  7. Diabetes mellitus screening  - Comprehensive Metabolic Panel (CMET)  8. Thyroid disorder screening  - TSH  9. Prostate cancer screening  Have discussed risks vs benefits, patient would like to pursue PSA.   - PSA  10. Need for Td vaccine  - Td vaccine greater than or equal to 7yo preservative free IM  The entirety of the information documented in the History of Present Illness, Review of Systems and Physical Exam were personally obtained by me. Portions of this information were initially documented by [facility], CMA and reviewed by me for thoroughness and accuracy.   F/u CPE 1 year      [name], PA-C  [facility]

## 2019-01-19 ENCOUNTER — Telehealth: Payer: Self-pay | Admitting: Physician Assistant

## 2019-01-19 ENCOUNTER — Telehealth: Payer: Self-pay

## 2019-01-19 LAB — LIPID PANEL
Chol/HDL Ratio: 3.5 ratio (ref 0.0–5.0)
Cholesterol, Total: 123 mg/dL (ref 100–199)
HDL: 35 mg/dL — ABNORMAL LOW (ref >39)
LDL Calculated: 65 mg/dL (ref 0–99)
Triglycerides: 115 mg/dL (ref 0–149)
VLDL Cholesterol Cal: 23 mg/dL (ref 5–40)

## 2019-01-19 LAB — COMPREHENSIVE METABOLIC PANEL
ALT: 21 IU/L (ref 0–44)
AST: 22 IU/L (ref 0–40)
Albumin/Globulin Ratio: 1.9 (ref 1.2–2.2)
Albumin: 4.2 g/dL (ref 3.8–4.9)
Alkaline Phosphatase: 145 IU/L — ABNORMAL HIGH (ref 39–117)
BUN/Creatinine Ratio: 18 (ref 9–20)
BUN: 15 mg/dL (ref 6–24)
Bilirubin Total: 0.8 mg/dL (ref 0.0–1.2)
CO2: 21 mmol/L (ref 20–29)
Calcium: 9.5 mg/dL (ref 8.7–10.2)
Chloride: 102 mmol/L (ref 96–106)
Creatinine, Ser: 0.85 mg/dL (ref 0.76–1.27)
GFR calc Af Amer: 112 mL/min/{1.73_m2} (ref >59)
GFR calc non Af Amer: 97 mL/min/{1.73_m2} (ref >59)
Globulin, Total: 2.2 g/dL (ref 1.5–4.5)
Glucose: 120 mg/dL — ABNORMAL HIGH (ref 65–99)
Potassium: 4.2 mmol/L (ref 3.5–5.2)
Sodium: 137 mmol/L (ref 134–144)
Total Protein: 6.4 g/dL (ref 6.0–8.5)

## 2019-01-19 LAB — HEPATITIS C ANTIBODY: Hep C Virus Ab: <0.1 s/co ratio (ref 0.0–0.9)

## 2019-01-19 LAB — CBC WITH DIFFERENTIAL/PLATELET
Basophils Absolute: 0 10*3/uL (ref 0.0–0.2)
Basos: 1 %
EOS (ABSOLUTE): 0.1 10*3/uL (ref 0.0–0.4)
Eos: 3 %
Hematocrit: 46 % (ref 37.5–51.0)
Hemoglobin: 15.8 g/dL (ref 13.0–17.7)
Immature Grans (Abs): 0 10*3/uL (ref 0.0–0.1)
Immature Granulocytes: 0 %
Lymphocytes Absolute: 1 10*3/uL (ref 0.7–3.1)
Lymphs: 22 %
MCH: 30 pg (ref 26.6–33.0)
MCHC: 34.3 g/dL (ref 31.5–35.7)
MCV: 87 fL (ref 79–97)
Monocytes Absolute: 0.4 10*3/uL (ref 0.1–0.9)
Monocytes: 9 %
Neutrophils Absolute: 3.1 10*3/uL (ref 1.4–7.0)
Neutrophils: 65 %
Platelets: 273 10*3/uL (ref 150–450)
RBC: 5.27 x10E6/uL (ref 4.14–5.80)
RDW: 11.8 % (ref 11.6–15.4)
WBC: 4.8 10*3/uL (ref 3.4–10.8)

## 2019-01-19 LAB — HIV ANTIBODY (ROUTINE TESTING W REFLEX): HIV Screen 4th Generation wRfx: NONREACTIVE

## 2019-01-19 LAB — PSA: Prostate Specific Ag, Serum: 0.7 ng/mL (ref 0.0–4.0)

## 2019-01-19 LAB — TSH: TSH: 1.18 u[IU]/mL (ref 0.450–4.500)

## 2019-01-19 NOTE — Telephone Encounter (Signed)
Test code was added

## 2019-01-19 NOTE — Telephone Encounter (Signed)
Medical record request faxed. KW

## 2019-01-19 NOTE — Telephone Encounter (Signed)
Can we please request colonoscopy from [facility] (2014)? Thanks.

## 2019-01-19 NOTE — Telephone Encounter (Signed)
-----   Message from [name], [place] sent at 01/19/2019  8:20 AM EDT ----- Can we add A1c under hyperglycemia? Thanks.

## 2019-01-23 LAB — HGB A1C W/O EAG: Hgb A1c MFr Bld: 5.6 % (ref 4.8–5.6)

## 2019-01-23 LAB — SPECIMEN STATUS REPORT

## 2019-01-25 ENCOUNTER — Telehealth: Payer: Self-pay

## 2019-01-25 NOTE — Telephone Encounter (Signed)
-----   Message from [name], [place] sent at 01/23/2019  1:15 PM EDT ----- Trevor Boyd looks normal.

## 2019-01-25 NOTE — Telephone Encounter (Signed)
Patient has been advised. KW 

## 2019-03-07 ENCOUNTER — Ambulatory Visit (INDEPENDENT_AMBULATORY_CARE_PROVIDER_SITE_OTHER): Payer: BC Managed Care – PPO | Admitting: Physician Assistant

## 2019-03-07 ENCOUNTER — Other Ambulatory Visit: Payer: Self-pay

## 2019-03-07 ENCOUNTER — Encounter: Payer: Self-pay | Admitting: Physician Assistant

## 2019-03-07 DIAGNOSIS — M7051 Other bursitis of knee, right knee: Secondary | ICD-10-CM | POA: Diagnosis not present

## 2019-03-07 MED ORDER — MELOXICAM 7.5 MG PO TABS: ORAL_TABLET | ORAL | 0 refills | Status: DC

## 2019-03-07 NOTE — Progress Notes (Signed)
       Patient: Trevor Boyd Male    DOB: 1961/07/14   58 y.o.   MRN: [id number] Visit Date: 03/07/2019  Today's Provider: [name], PA-C   Chief Complaint  Patient presents with  . Knee Pain   Subjective:     HPI   Patient is presenting today with right knee pain and swelling for several days. He works in [employer] and is occasionally on the [employer]. He reports some warmth in the area. He denies injuries. Denies cuts to the area.   No Known Allergies   Current Outpatient Medications:  .  sertraline (ZOLOFT) 100 MG tablet, Take 1 tablet (100 mg total) by mouth daily., Disp: 90 tablet, Rfl: 3 .  Turmeric (QC TUMERIC COMPLEX PO), Take by mouth., Disp: , Rfl:   Review of Systems  Constitutional: Negative.   Cardiovascular: Negative.   Musculoskeletal: Positive for joint swelling and myalgias.    Social History   Tobacco Use  . Smoking status: Never Smoker  . Smokeless tobacco: Never Used  Substance Use Topics  . Alcohol use: No    Alcohol/week: 0.0 standard drinks      Objective:   BP 123/78 (BP Location: Left Arm, Patient Position: Sitting, Cuff Size: Large)   Pulse 73   Temp 98.2 F (36.8 C) (Oral)   Resp 16   Ht 6\' 1"  (1.854 m)   Wt 206 lb 9.6 oz (93.7 kg)   BMI 27.26 kg/m  Vitals:   03/07/19 0827  BP: 123/78  Pulse: 73  Resp: 16  Temp: 98.2 F (36.8 C)  TempSrc: Oral  Weight: 206 lb 9.6 oz (93.7 kg)  Height: 6\' 1"  (1.854 m)     Physical Exam Constitutional:      Appearance: Normal appearance.  Musculoskeletal:     Right knee: He exhibits swelling. He exhibits normal range of motion.       Legs:  Skin:    General: Skin is warm and dry.  Neurological:     Mental Status: He is alert and oriented to person, place, and time. Mental status is at baseline.  Psychiatric:        Mood and Affect: Mood normal.        Behavior: Behavior normal.      No results found for any visits on 03/07/19.     Assessment & Plan   1. Bursitis of right knee, unspecified bursa  Explained RICE treatment. 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 Work note provided.   - meloxicam (MOBIC) 7.5 MG tablet; Take 1 to 2 pills daily for the next week.  Dispense: 30 tablet; Refill: 0  The entirety of the information documented in the History of Present Illness, Review of Systems and Physical Exam were personally obtained by me. Portions of this information were initially documented by Lynford Humphrey, CMA and reviewed by me for thoroughness and accuracy.   F/u PRN    [name], PA-C  [facility]

## 2019-03-07 NOTE — Patient Instructions (Signed)
Bursitis  Bursitis is when the fluid-filled sac (bursa) that covers and protects a joint is swollen (inflamed). Bursitis is most common near joints such as the knees, elbows, hips, and shoulders. It can cause pain and stiffness. Follow these instructions at home: Medicines  Take over-the-counter and prescription medicines only as told by your doctor.  If you were prescribed an antibiotic medicine, take it as told by your doctor. Do not stop taking it even if you start to feel better. General instructions   Rest the affected area as told by your doctor. ? If you can, raise (elevate) the affected area above the level of your heart while you are sitting or lying down. ? Avoid doing things that make the pain worse.  Use a splint, brace, pad, or walking aid as told by your doctor.  If directed, put ice on the affected area: ? If you have a removable splint or brace, take it off as told by your doctor. ? Put ice in a plastic bag. ? Place a towel between your skin and the bag, or between the splint or brace and the bag. ? Leave the ice on for 20 minutes, 2-3 times a day.  Keep all follow-up visits as told by your doctor. This is important. Preventing symptoms Do these things to help you not have symptoms again:  Wear knee pads if you kneel often.  Wear sturdy running or walking shoes that fit you well.  Take a lot of breaks during activities that involve doing the same movements again and again.  Before you do any activity that takes a lot of effort, get your body ready by stretching.  Stay at a healthy weight or lose weight if your doctor says you should. If you need help doing this, ask your doctor.  Exercise often. If you start any new physical activity, do it slowly. Contact a doctor if you:  Have a fever.  Have chills.  Have symptoms that do not get better with treatment or home care. Summary  Bursitis is when the fluid-filled sac (bursa) that covers and protects a joint  is swollen.  Rest the affected area as told by your doctor.  Avoid doing things that make the pain worse.  Put ice on the affected area as told by your doctor. This information is not intended to replace advice given to you by your health care provider. Make sure you discuss any questions you have with your health care provider. Document Released: 02/10/2010 Document Revised: 08/05/2017 Document Reviewed: 07/08/2017 Elsevier Patient Education  2020 [employer].

## 2019-04-23 ENCOUNTER — Telehealth: Payer: Self-pay | Admitting: Physician Assistant

## 2019-04-23 DIAGNOSIS — F439 Reaction to severe stress, unspecified: Secondary | ICD-10-CM

## 2019-04-23 MED ORDER — SERTRALINE HCL 100 MG PO TABS: 100.0000 mg | ORAL_TABLET | Freq: Every day | ORAL | 3 refills | Status: DC

## 2019-04-23 NOTE — Telephone Encounter (Signed)
Pt needs refill on  Sertraline 100 mg  [facility]  [address]

## 2019-04-23 NOTE — Telephone Encounter (Signed)
Please review

## 2019-04-23 NOTE — Telephone Encounter (Signed)
Filled

## 2019-07-02 ENCOUNTER — Encounter: Payer: Self-pay | Admitting: Family Medicine

## 2019-07-02 ENCOUNTER — Ambulatory Visit (INDEPENDENT_AMBULATORY_CARE_PROVIDER_SITE_OTHER): Payer: BC Managed Care – PPO | Admitting: Family Medicine

## 2019-07-02 ENCOUNTER — Ambulatory Visit
Admission: RE | Admit: 2019-07-02 | Discharge: 2019-07-02 | Disposition: A | Discharge status: Home / Self Care | Payer: BC Managed Care – PPO | Source: Ambulatory Visit | Attending: Family Medicine | Admitting: Family Medicine

## 2019-07-02 ENCOUNTER — Other Ambulatory Visit: Payer: Self-pay

## 2019-07-02 VITALS — BP 126/86 | HR 89 | Temp 97.3°F | Wt 205.0 lb

## 2019-07-02 DIAGNOSIS — M79661 Pain in right lower leg: Secondary | ICD-10-CM

## 2019-07-02 DIAGNOSIS — I82441 Acute embolism and thrombosis of right tibial vein: Secondary | ICD-10-CM | POA: Diagnosis not present

## 2019-07-02 DIAGNOSIS — M7989 Other specified soft tissue disorders: Secondary | ICD-10-CM

## 2019-07-02 DIAGNOSIS — I82431 Acute embolism and thrombosis of right popliteal vein: Secondary | ICD-10-CM | POA: Diagnosis not present

## 2019-07-02 DIAGNOSIS — R0989 Other specified symptoms and signs involving the circulatory and respiratory systems: Secondary | ICD-10-CM | POA: Diagnosis not present

## 2019-07-02 MED ORDER — RIVAROXABAN 20 MG PO TABS: 20.0000 mg | ORAL_TABLET | Freq: Every day | ORAL | 1 refills | Status: DC

## 2019-07-02 MED ORDER — RIVAROXABAN (XARELTO) VTE STARTER PACK (15 & 20 MG): ORAL_TABLET | ORAL | 0 refills | Status: DC

## 2019-07-02 NOTE — Progress Notes (Signed)
Patient: Trevor Boyd Male    DOB: 08-13-1961   58 y.o.   MRN: [id number] Visit Date: 07/02/2019  Today's Provider: Lavon Paganini, MD   Chief Complaint  Patient presents with  . Leg Pain    right lower leg   Subjective:     Leg Pain  The incident occurred 3 to 5 days ago. There was no injury mechanism. Quality: Sharp, sharp. The pain has been constant since onset. Associated symptoms include numbness (Comes and goes). Pertinent negatives include no inability to bear weight, loss of motion, loss of sensation, muscle weakness or tingling. The symptoms are aggravated by weight bearing and palpation. He has tried NSAIDs for the symptoms. The treatment provided mild relief.   Woke up with pain Worsening since then Feels sharp Walking hurts worse Feels numb whne sitting in place Unsure if there is swelling  No previous surgery/injury No periods of imobility   No Known Allergies   Current Outpatient Medications:  .  meloxicam (MOBIC) 7.5 MG tablet, Take 1 to 2 pills daily for the next week., Disp: 30 tablet, Rfl: 0 .  sertraline (ZOLOFT) 100 MG tablet, Take 1 tablet (100 mg total) by mouth daily., Disp: 90 tablet, Rfl: 3 .  Turmeric (QC TUMERIC COMPLEX PO), Take by mouth., Disp: , Rfl:   Review of Systems  Constitutional: Negative.   Cardiovascular: Negative for leg swelling.  Musculoskeletal: Positive for gait problem and myalgias. Negative for arthralgias, back pain, joint swelling, neck pain and neck stiffness.  Neurological: Positive for numbness (Comes and goes). Negative for tingling.    Social History   Tobacco Use  . Smoking status: Never Smoker  . Smokeless tobacco: Never Used  Substance Use Topics  . Alcohol use: No    Alcohol/week: 0.0 standard drinks      Objective:   BP 126/86 (BP Location: Right Arm, Patient Position: Sitting, Cuff Size: Normal)   Pulse 89   Temp (!) 97.3 F (36.3 C) (Temporal)   Wt 205 lb (93 kg)   BMI 27.05 kg/m  Vitals:    07/02/19 1403  BP: 126/86  Pulse: 89  Temp: (!) 97.3 F (36.3 C)  TempSrc: Temporal  Weight: 205 lb (93 kg)  Body mass index is 27.05 kg/m.   Physical Exam Vitals signs reviewed.  Constitutional:      General: He is not in acute distress.    Appearance: Normal appearance.  HENT:     Head: Normocephalic and atraumatic.  Eyes:     General: No scleral icterus.    Conjunctiva/sclera: Conjunctivae normal.  Cardiovascular:     Rate and Rhythm: Normal rate and regular rhythm.     Pulses: Normal pulses.     Heart sounds: Normal heart sounds. No murmur.  Pulmonary:     Effort: Pulmonary effort is normal. No respiratory distress.     Breath sounds: Normal breath sounds. No wheezing.  Musculoskeletal:     Right lower leg: Edema present.     Left lower leg: No edema.     Comments: R calf measures 3cm larger in diameter than L calf. + Homans sign.  Palpable cord in R calf. TTP of R calf  Skin:    General: Skin is warm and dry.     Capillary Refill: Capillary refill takes less than 2 seconds.     Findings: No erythema or rash.  Neurological:     Mental Status: He is alert and oriented to person, place, and time.  Mental status is at baseline.  Psychiatric:        Mood and Affect: Mood normal.        Behavior: Behavior normal.      No results found for any visits on 07/02/19.     Assessment & Plan   1. Tenderness of right calf 2. Homans sign present 3. Swelling of calf - new problem x5 days - with +Homans sign, palpable cord, and TTP as well as asymmetric edema, concern for possible DVT - unclear about possible inciting event - STAT LE duplex to r/o DVT - US Venous Img Lower Unilateral Right; Future   Update: Patient is positive for acute DVT - will start Xarelto (last Cr wnl) - discussed starter pack and need to take for at least 3 months - may need hypercoag workup - return precautions discussed - f/u with PCP in 1 month   The entirety of the information  documented in the History of Present Illness, Review of Systems and Physical Exam were personally obtained by me. Portions of this information were initially documented by Ashley Royalty, CMA and reviewed by me for thoroughness and accuracy.    Izeah Vossler, Dionne Bucy, MD MPH [facility]

## 2019-07-05 ENCOUNTER — Telehealth: Payer: Self-pay

## 2019-07-05 NOTE — Telephone Encounter (Signed)
Returned patient's call.  He states that his calf is still tender and he also has a little bit of tenderness in his inner thigh on the same side as the DVT.  He is taking his Xarelto with good compliance.  He denies any side effects.  Explained to the patient that his veins of his upper leg were not well visualized on ultrasound per report and that he may have some extension of the DVT into his thigh.  Discussed that he should continue Xarelto, elevate his leg when possible, and he can use ice and Tylenol for the pain if needed.  Discussed that if he develops any chest pain, shortness of breath, tachycardia, he should have that looked at immediately as that could be a sign of a PE.  Advised him to call us back next week if this persists or gets worse.  Could consider reimaging versus vascular consult if needed.  FYI to PCP.

## 2019-07-05 NOTE — Telephone Encounter (Signed)
Patient would like a call back from Dr. B regarding his recent DVT diagnosis. He states he is still having a intermittent dull pain. He wants to know if that is normal.  CB#[phone]

## 2019-07-24 ENCOUNTER — Ambulatory Visit
Admission: RE | Admit: 2019-07-24 | Discharge: 2019-07-24 | Disposition: A | Discharge status: Home / Self Care | Payer: BC Managed Care – PPO | Source: Ambulatory Visit | Attending: Physician Assistant | Admitting: Physician Assistant

## 2019-07-24 ENCOUNTER — Encounter: Payer: Self-pay | Admitting: Physician Assistant

## 2019-07-24 ENCOUNTER — Ambulatory Visit (INDEPENDENT_AMBULATORY_CARE_PROVIDER_SITE_OTHER): Payer: BC Managed Care – PPO | Admitting: Physician Assistant

## 2019-07-24 ENCOUNTER — Other Ambulatory Visit: Payer: Self-pay

## 2019-07-24 VITALS — BP 128/83 | HR 78 | Temp 96.9°F

## 2019-07-24 DIAGNOSIS — S838X2A Sprain of other specified parts of left knee, initial encounter: Secondary | ICD-10-CM | POA: Insufficient documentation

## 2019-07-24 DIAGNOSIS — M25562 Pain in left knee: Secondary | ICD-10-CM | POA: Diagnosis not present

## 2019-07-24 DIAGNOSIS — S8992XA Unspecified injury of left lower leg, initial encounter: Secondary | ICD-10-CM | POA: Diagnosis not present

## 2019-07-24 DIAGNOSIS — S83412A Sprain of medial collateral ligament of left knee, initial encounter: Secondary | ICD-10-CM | POA: Insufficient documentation

## 2019-07-24 MED ORDER — METHYLPREDNISOLONE 4 MG PO TBPK: ORAL_TABLET | ORAL | 0 refills | Status: DC

## 2019-07-24 NOTE — Progress Notes (Signed)
Patient: Trevor Boyd Male    DOB: 03/01/1961   58 y.o.   MRN: [id number] Visit Date: 07/24/2019  Today's Provider: Mar Daring, PA-C   Chief Complaint  Patient presents with  . Knee Pain   Subjective:     HPI Patient here today C/O left knee pain and mild swelling. Patient reports he fell from a tree yesterday. Patient reports pain is worse when he first starts to walk. Once moving the pain improves. Patient reports he has not taken any medications for pain due to being on xarelto. He is wearing a knee brace that helps.   No Known Allergies   Current Outpatient Medications:  .  rivaroxaban (XARELTO) 20 MG TABS tablet, Take 1 tablet (20 mg total) by mouth daily with supper., Disp: 30 tablet, Rfl: 1 .  sertraline (ZOLOFT) 100 MG tablet, Take 1 tablet (100 mg total) by mouth daily., Disp: 90 tablet, Rfl: 3 .  Turmeric (QC TUMERIC COMPLEX PO), Take by mouth., Disp: , Rfl:   Review of Systems  Constitutional: Negative.   Respiratory: Negative.   Cardiovascular: Negative.   Musculoskeletal: Positive for arthralgias and joint swelling. Negative for back pain and myalgias.    Social History   Tobacco Use  . Smoking status: Never Smoker  . Smokeless tobacco: Never Used  Substance Use Topics  . Alcohol use: No    Alcohol/week: 0.0 standard drinks      Objective:   BP 128/83 (BP Location: Left Arm, Patient Position: Sitting, Cuff Size: Large)   Pulse 78   Temp (!) 96.9 F (36.1 C) (Temporal)  Vitals:   07/24/19 1328  BP: 128/83  Pulse: 78  Temp: (!) 96.9 F (36.1 C)  TempSrc: Temporal  There is no height or weight on file to calculate BMI.   Physical Exam Vitals signs reviewed.  Constitutional:      General: He is not in acute distress.    Appearance: Normal appearance. He is well-developed. He is not ill-appearing or diaphoretic.  HENT:     Head: Normocephalic and atraumatic.  Neck:     Musculoskeletal: Normal range of motion and neck supple.   Cardiovascular:     Rate and Rhythm: Normal rate and regular rhythm.     Heart sounds: Normal heart sounds. No murmur. No friction rub. No gallop.   Pulmonary:     Effort: Pulmonary effort is normal. No respiratory distress.     Breath sounds: Normal breath sounds. No wheezing or rales.  Musculoskeletal:     Left knee: He exhibits swelling, abnormal meniscus and MCL laxity. He exhibits normal range of motion, no effusion, no ecchymosis, no deformity, no laceration, no erythema, normal alignment, no LCL laxity, normal patellar mobility and no bony tenderness. Tenderness found. Medial joint line and MCL tenderness noted.     Comments: Varus stress test elicited pain with straight knee and 30 degree flexion, Normal Valgus stress. Negative Lachman, Negative Anterior/Posterior drawer, positive McMurray with internal rotation stressing for pain, negative external rotation mcmurray, negative patellar apprehension, negative patellar grind  Neurological:     Mental Status: He is alert.      No results found for any visits on 07/24/19.     Assessment & Plan    1. Sprain of medial collateral ligament of left knee, initial encounter Suspect MCL sprain and possible meniscal injury (most likely medial posterior horn due to pain with McMurray and where he felt it). Currently no instability or  locking. Will get xray as below to r/o any bony involvement. I do not suspect full tear of MCL or meniscus. Will treat conservatively with medrol as below. Advised to continue wearing a knee brace with side supports. Ice after work. Elevate when he can. Exercises printed for patient. Discussed expected course of 8-12 week recovery could be possible. Call if symptoms worsening and will refer to ortho.  - methylPREDNISolone (MEDROL) 4 MG TBPK tablet; 6 day taper; take as directed on package instructions  Dispense: 21 tablet; Refill: 0 - DG Knee Complete 4 Views Left; Future  2. Meniscal injury, left, initial encounter  See above medical treatment plan. - methylPREDNISolone (MEDROL) 4 MG TBPK tablet; 6 day taper; take as directed on package instructions  Dispense: 21 tablet; Refill: 0 - DG Knee Complete 4 Views Left; Future     Mar Daring, PA-C  [facility]

## 2019-07-24 NOTE — Patient Instructions (Signed)
Meniscus Tear    A meniscus tear is a knee injury that happens when a piece of the meniscus is torn. The meniscus is a thick, rubbery, wedge-shaped cartilage in the knee. Two menisci are located in each knee. They sit between the upper bone (femur) and lower bone (tibia) that make up the knee joint. Each meniscus acts as a shock absorber for the knee.  A torn meniscus is one of the most common types of knee injuries. This injury can range from mild to severe. Surgery may be needed to repair a severe tear.  What are the causes?  This condition may be caused by any kneeling, squatting, twisting, or pivoting movement. Sports-related injuries are the most common cause. These often occur from:  · Running and stopping suddenly.  ? Changing direction.  ? Being tackled or knocked off your feet.  · Lifting or carrying heavy weights.  As people get older, their menisci get thinner and weaker. In these people, tears can happen more easily, such as from climbing stairs.  What increases the risk?  You are more likely to develop this condition if you:  · Play contact sports.  · Have a job that requires kneeling or squatting.  · Are male.  · Are over 40 years old.  What are the signs or symptoms?  Symptoms of this condition include:  · Knee pain, especially at the side of the knee joint. You may feel pain when the injury occurs, or you may only hear a pop and feel pain later.  · A feeling that your knee is clicking, catching, locking, or giving way.  · Not being able to fully bend or extend your knee.  · Bruising or swelling in your knee.  How is this diagnosed?  This condition may be diagnosed based on your symptoms and a physical exam.  You may also have tests, such as:  · X-rays.  · MRI.  · A procedure to look inside your knee with a narrow surgical telescope (arthroscopy).  You may be referred to a knee specialist (orthopedic surgeon).  How is this treated?  Treatment for this injury depends on the severity of the tear.  Treatment for a mild tear may include:  · Rest.  · Medicine to reduce pain and swelling. This is usually a nonsteroidal anti-inflammatory drug (NSAID), like ibuprofen.  · A knee brace, sleeve, or wrap.  · Using crutches or a walker to keep weight off your knee and to help you walk.  · Exercises to strengthen your knee (physical therapy).  You may need surgery if you have a severe tear or if other treatments are not working.  Follow these instructions at home:  If you have a brace, sleeve, or wrap:  · Wear it as told by your health care provider. Remove it only as told by your health care provider.  · Loosen the brace, sleeve, or wrap if your toes tingle, become numb, or turn cold and blue.  · Keep the brace, sleeve, or wrap clean and dry.  · If the brace, sleeve, or wrap is not waterproof:  ? Do not let it get wet.  ? Cover it with a watertight covering when you take a bath or shower.  Managing pain and swelling    · Take over-the-counter and prescription medicines only as told by your health care provider.  · If directed, put ice on your knee:  ? If you have a removable brace, sleeve, or wrap, remove it   elevate) the injured area above the level of your heart while you are sitting or lying down. Activity  Do not use the injured limb to support your body weight until your health care provider says that you can. Use crutches or a walker as told by your health care provider.  Return to your normal activities as told by your health care provider. Ask your health care provider what activities are safe for you.  Perform range-of-motion exercises only as told by your health care provider.  Begin doing exercises to strengthen your knee and leg muscles only as told by your  health care provider. After you recover, your health care provider may recommend these exercises to help prevent another injury. General instructions  Use a knee brace, sleeve, or wrap as told by your health care provider.  Ask your health care provider when it is safe to drive if you have a brace, sleeve, or wrap on your knee.  Do not use any products that contain nicotine or tobacco, such as cigarettes, e-cigarettes, and chewing tobacco. If you need help quitting, ask your health care provider.  Ask your health care provider if the medicine prescribed to you: ? Requires you to avoid driving or using heavy machinery. ? Can cause constipation. You may need to take these actions to prevent or treat constipation:  Drink enough fluid to keep your urine pale yellow.  Take over-the-counter or prescription medicines.  Eat foods that are high in fiber, such as beans, whole grains, and fresh fruits and vegetables.  Limit foods that are high in fat and processed sugars, such as fried or sweet foods.  Keep all follow-up visits as told by your health care provider. This is important. Contact a health care provider if:  You have a fever.  Your knee becomes red, tender, or swollen.  Your pain medicine is not helping.  Your symptoms get worse or do not improve after 2 weeks of home care. Summary  A meniscus tear is a knee injury that happens when a piece of the meniscus is torn.  Treatment for this injury depends on the severity of the tear. You may need surgery if you have a severe tear or if other treatments are not working.  Rest, ice, and raise (elevate) your injured knee as told by your health care provider. This will help lessen pain and swelling.  Contact a health care provider if you have new symptoms, or your symptoms get worse or do not improve after 2 weeks of home care.  Keep all follow-up visits as told by your health care provider. This is important. This information is not  intended to replace advice given to you by your health care provider. Make sure you discuss any questions you have with your health care provider. Document Released: 11/13/2002 Document Revised: 03/07/2018 Document Reviewed: 03/07/2018 Elsevier Patient Education  2020 [place]. Medial Collateral Knee Ligament Sprain  The medial collateral ligament (MCL) is a tough band of tissue in the knee that connects the thigh bone to the shin bone. Your MCL prevents your knee from moving too far inward and helps to keep your knee stable. An MCL sprain is a stretch or tear in the MCL. What are the causes? This condition may be caused by:  A hard, direct hit (trauma) to the inside of your knee. This is a common cause.  Your knee falling inward when you run, change directions quickly (cut), jump, or pivot.  Repeatedly overstretching the MCL. What increases  the risk? The following factors make you more likely to develop this condition:  Playing contact sports, such as wrestling or football.  Participating in sports that involve sudden movements of cutting, twisting, or turning. These movements are common in hockey, skiing, and soccer.  Having weak hip and core muscles. What are the signs or symptoms? Symptoms of this condition include:  Feeling or hearing a popping at the time of injury.  Pain on the inside of the knee.  Swelling in the knee.  Bruising around the knee.  Tenderness when pressing the inside of the knee.  Feeling unstable when you stand, like your knee will give way.  Difficulty walking on uneven surfaces. How is this diagnosed? This condition may be diagnosed based on:  Your medical history.  A physical exam.  Imaging tests, such as an X-ray, ultrasound, or MRI. During your physical exam, your health care provider will check for pain, limited motion, and instability. How is this treated? Treatment for this condition depends on how severe the injury is. Treatment  may include:  Keeping weight off the knee until swelling and pain improve.  Raising (elevating) the knee above the level of your heart. This helps to reduce swelling.  Icing the knee. This helps to reduce swelling.  Taking an NSAID, such as ibuprofen. This helps to reduce pain and swelling.  Using a knee brace, elastic sleeve, or crutches while the injury heals.  Using a knee brace when participating in athletic activities.  Doing rehab exercises (physical therapy).  Surgery. This may be needed if: ? Your MCL tore all the way through. ? Your knee is unstable. ? Your knee is not getting better with other treatments. Follow these instructions at home: If you have a brace or sleeve:  Wear it as told by your health care provider. Remove it only as told by your health care provider.  Loosen the brace or remove the sleeve if your toes tingle, become numb, or turn cold and blue.  Keep the brace or sleeve clean.  If the brace or sleeve is not waterproof: ? Do not let it get wet. ? Cover it with a watertight covering when you take a bath or shower. Managing pain, stiffness, and swelling   If directed, put ice on the inside area of your knee. ? If you have a removable brace or sleeve, remove it as told by your health care provider. ? Put ice in a plastic bag. ? Place a towel between your skin and the bag. ? Leave the ice on for 20 minutes, 2-3 times a day.  Move your foot and toes often to reduce stiffness and swelling.  Elevate the injured area above the level of your heart while you are sitting or lying down. Activity  Ask your health care provider when it is safe to drive if you have a brace or sleeve on your leg.  Return to your normal activities as told by your health care provider. Ask your health care provider what activities are safe for you.  Do exercises as told by your health care provider.  Do not use the injured leg to support your body weight until your health  care provider says that you can. Use crutches as told by your health care provider. General instructions  Take over-the-counter and prescription medicines only as told by your health care provider.  Do not use any products that contain nicotine or tobacco, such as cigarettes, e-cigarettes, and chewing tobacco. These can delay healing.  If you need help quitting, ask your health care provider.  Keep all follow-up visits as told by your health care provider. This is important. How is this prevented?  Warm up and stretch before being active.  Cool down and stretch after being active.  Give your body time to rest between periods of activity.  Make sure to use equipment that fits you.  Be safe and responsible while being active. This will help you avoid falls.  Do at least 150 minutes of moderate-intensity exercise each week, such as brisk walking or water aerobics.  Maintain physical fitness, including: ? Strength. ? Flexibility. ? Cardiovascular fitness. ? Endurance. Contact a health care provider if:  Your symptoms do not improve.  Your symptoms get worse. Summary  An MCL sprain is a knee injury that is caused by stretching the MCL too far. The injury can involve a tear in the MCL.  Treatment for this condition depends on how severe the injury is. It may include rest, wearing a brace, or surgery.  Do not use the injured leg to support your body weight until your health care provider says that you can. Use crutches as told by your health care provider.  Contact a health care provider if your symptoms do not get better or they get worse.  Keep all follow-up visits as told by your health care provider. This is important. This information is not intended to replace advice given to you by your health care provider. Make sure you discuss any questions you have with your health care provider. Document Released: 08/23/2005 Document Revised: 04/12/2018 Document Reviewed: 04/12/2018  Elsevier Patient Education  2020 [place] for Nurse Practitioners, 15(4), [phone]. Retrieved June 12, 2018 from http://clinicalkey.com/nursing">  Knee Exercises Ask your health care provider which exercises are safe for you. Do exercises exactly as told by your health care provider and adjust them as directed. It is normal to feel mild stretching, pulling, tightness, or discomfort as you do these exercises. Stop right away if you feel sudden pain or your pain gets worse. Do not begin these exercises until told by your health care provider. Stretching and range-of-motion exercises These exercises warm up your muscles and joints and improve the movement and flexibility of your knee. These exercises also help to relieve pain and swelling. Knee extension, prone 1. Lie on your abdomen (prone position) on a bed. 2. Place your left / right knee just beyond the edge of the surface so your knee is not on the bed. You can put a towel under your left / right thigh just above your kneecap for comfort. 3. Relax your leg muscles and allow gravity to straighten your knee (extension). You should feel a stretch behind your left / right knee. 4. Hold this position for __________ seconds. 5. Scoot up so your knee is supported between repetitions. Repeat __________ times. Complete this exercise __________ times a day. Knee flexion, active  1. Lie on your back with both legs straight. If this causes back discomfort, bend your left / right knee so your foot is flat on the floor. 2. Slowly slide your left / right heel back toward your buttocks. Stop when you feel a gentle stretch in the front of your knee or thigh (flexion). 3. Hold this position for __________ seconds. 4. Slowly slide your left / right heel back to the starting position. Repeat __________ times. Complete this exercise __________ times a day. Quadriceps stretch, prone  1. Lie on your abdomen  on a firm surface, such as a bed or  padded floor. 2. Bend your left / right knee and hold your ankle. If you cannot reach your ankle or pant leg, loop a belt around your foot and grab the belt instead. 3. Gently pull your heel toward your buttocks. Your knee should not slide out to the side. You should feel a stretch in the front of your thigh and knee (quadriceps). 4. Hold this position for __________ seconds. Repeat __________ times. Complete this exercise __________ times a day. Hamstring, supine 1. Lie on your back (supine position). 2. Loop a belt or towel over the ball of your left / right foot. The ball of your foot is on the walking surface, right under your toes. 3. Straighten your left / right knee and slowly pull on the belt to raise your leg until you feel a gentle stretch behind your knee (hamstring). ? Do not let your knee bend while you do this. ? Keep your other leg flat on the floor. 4. Hold this position for __________ seconds. Repeat __________ times. Complete this exercise __________ times a day. Strengthening exercises These exercises build strength and endurance in your knee. Endurance is the ability to use your muscles for a long time, even after they get tired. Quadriceps, isometric This exercise stretches the muscles in front of your thigh (quadriceps) without moving your knee joint (isometric). 1. Lie on your back with your left / right leg extended and your other knee bent. Put a rolled towel or small pillow under your knee if told by your health care provider. 2. Slowly tense the muscles in the front of your left / right thigh. You should see your kneecap slide up toward your hip or see increased dimpling just above the knee. This motion will push the back of the knee toward the floor. 3. For __________ seconds, hold the muscle as tight as you can without increasing your pain. 4. Relax the muscles slowly and completely. Repeat __________ times. Complete this exercise __________ times a day. Straight  leg raises This exercise stretches the muscles in front of your thigh (quadriceps) and the muscles that move your hips (hip flexors). 1. Lie on your back with your left / right leg extended and your other knee bent. 2. Tense the muscles in the front of your left / right thigh. You should see your kneecap slide up or see increased dimpling just above the knee. Your thigh may even shake a bit. 3. Keep these muscles tight as you raise your leg 4-6 inches (10-15 cm) off the floor. Do not let your knee bend. 4. Hold this position for __________ seconds. 5. Keep these muscles tense as you lower your leg. 6. Relax your muscles slowly and completely after each repetition. Repeat __________ times. Complete this exercise __________ times a day. Hamstring, isometric 1. Lie on your back on a firm surface. 2. Bend your left / right knee about __________ degrees. 3. Dig your left / right heel into the surface as if you are trying to pull it toward your buttocks. Tighten the muscles in the back of your thighs (hamstring) to "dig" as hard as you can without increasing any pain. 4. Hold this position for __________ seconds. 5. Release the tension gradually and allow your muscles to relax completely for __________ seconds after each repetition. Repeat __________ times. Complete this exercise __________ times a day. Hamstring curls If told by your health care provider, do this exercise while wearing ankle  weights. Begin with __________ lb weights. Then increase the weight by 1 lb (0.5 kg) increments. Do not wear ankle weights that are more than __________ lb. 1. Lie on your abdomen with your legs straight. 2. Bend your left / right knee as far as you can without feeling pain. Keep your hips flat against the floor. 3. Hold this position for __________ seconds. 4. Slowly lower your leg to the starting position. Repeat __________ times. Complete this exercise __________ times a day. Squats This exercise  strengthens the muscles in front of your thigh and knee (quadriceps). 1. Stand in front of a table, with your feet and knees pointing straight ahead. You may rest your hands on the table for balance but not for support. 2. Slowly bend your knees and lower your hips like you are going to sit in a chair. ? Keep your weight over your heels, not over your toes. ? Keep your lower legs upright so they are parallel with the table legs. ? Do not let your hips go lower than your knees. ? Do not bend lower than told by your health care provider. ? If your knee pain increases, do not bend as low. 3. Hold the squat position for __________ seconds. 4. Slowly push with your legs to return to standing. Do not use your hands to pull yourself to standing. Repeat __________ times. Complete this exercise __________ times a day. Wall slides This exercise strengthens the muscles in front of your thigh and knee (quadriceps). 1. Lean your back against a smooth wall or door, and walk your feet out 18-24 inches (46-61 cm) from it. 2. Place your feet hip-width apart. 3. Slowly slide down the wall or door until your knees bend __________ degrees. Keep your knees over your heels, not over your toes. Keep your knees in line with your hips. 4. Hold this position for __________ seconds. Repeat __________ times. Complete this exercise __________ times a day. Straight leg raises This exercise strengthens the muscles that rotate the leg at the hip and move it away from your body (hip abductors). 1. Lie on your side with your left / right leg in the top position. Lie so your head, shoulder, knee, and hip line up. You may bend your bottom knee to help you keep your balance. 2. Roll your hips slightly forward so your hips are stacked directly over each other and your left / right knee is facing forward. 3. Leading with your heel, lift your top leg 4-6 inches (10-15 cm). You should feel the muscles in your outer hip lifting. ? Do  not let your foot drift forward. ? Do not let your knee roll toward the ceiling. 4. Hold this position for __________ seconds. 5. Slowly return your leg to the starting position. 6. Let your muscles relax completely after each repetition. Repeat __________ times. Complete this exercise __________ times a day. Straight leg raises This exercise stretches the muscles that move your hips away from the front of the pelvis (hip extensors). 1. Lie on your abdomen on a firm surface. You can put a pillow under your hips if that is more comfortable. 2. Tense the muscles in your buttocks and lift your left / right leg about 4-6 inches (10-15 cm). Keep your knee straight as you lift your leg. 3. Hold this position for __________ seconds. 4. Slowly lower your leg to the starting position. 5. Let your leg relax completely after each repetition. Repeat __________ times. Complete this exercise __________ times  a day. This information is not intended to replace advice given to you by your health care provider. Make sure you discuss any questions you have with your health care provider. Document Released: 07/07/2005 Document Revised: 06/13/2018 Document Reviewed: 06/13/2018 Elsevier Patient Education  2020 [employer].

## 2019-07-25 ENCOUNTER — Telehealth: Payer: Self-pay

## 2019-07-25 NOTE — Telephone Encounter (Signed)
-----   Message from Mar Daring, [place] sent at 07/25/2019  8:28 AM EST ----- There is some osteoarthritis noted but no fractures or joint effusion.

## 2019-07-25 NOTE — Telephone Encounter (Signed)
Pt advised.   Thanks,   -Audie Wieser  

## 2019-07-31 ENCOUNTER — Other Ambulatory Visit: Payer: Self-pay

## 2019-07-31 ENCOUNTER — Encounter: Payer: Self-pay | Admitting: Physician Assistant

## 2019-07-31 ENCOUNTER — Telehealth: Payer: Self-pay | Admitting: Gastroenterology

## 2019-07-31 ENCOUNTER — Telehealth: Payer: Self-pay

## 2019-07-31 ENCOUNTER — Ambulatory Visit (INDEPENDENT_AMBULATORY_CARE_PROVIDER_SITE_OTHER): Payer: BC Managed Care – PPO | Admitting: Physician Assistant

## 2019-07-31 VITALS — BP 132/81 | HR 77 | Temp 96.8°F | Wt 207.0 lb

## 2019-07-31 DIAGNOSIS — Z1211 Encounter for screening for malignant neoplasm of colon: Secondary | ICD-10-CM | POA: Diagnosis not present

## 2019-07-31 DIAGNOSIS — I82431 Acute embolism and thrombosis of right popliteal vein: Secondary | ICD-10-CM

## 2019-07-31 NOTE — Telephone Encounter (Signed)
Haven't seen it yet. I didn't think they'd get him in that soon. 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 I was hoping he would would be able to see hem/onc first for them to recommend.

## 2019-07-31 NOTE — Telephone Encounter (Signed)
Gastroenterology Pre-Procedure Review  Request Date: Wed 08/08/19 Requesting Physician: Dr. Ronelle Nigh  PATIENT REVIEW QUESTIONS: The patient responded to the following health history questions as indicated:    1. Are you having any GI issues? no 2. Do you have a personal history of Polyps? no 3. Do you have a family history of Colon Cancer or Polyps? no 4. Diabetes Mellitus? no 5. Joint replacements in the past 12 months?no 6. Major health problems in the past 3 months?no 7. Any artificial heart valves, MVP, or defibrillator?no    MEDICATIONS & ALLERGIES:    Patient reports the following regarding taking any anticoagulation/antiplatelet therapy:   Plavix, Coumadin, Eliquis, Xarelto, Lovenox, Pradaxa, Brilinta, or Effient? yes (Xarelto blood thinner request sent to Dr. Briant Cedar) Aspirin? no  Patient confirms/reports the following medications:  Current Outpatient Medications  Medication Sig Dispense Refill  . methylPREDNISolone (MEDROL) 4 MG TBPK tablet 6 day taper; take as directed on package instructions (Patient not taking: Reported on 07/31/2019) 21 tablet 0  . rivaroxaban (XARELTO) 20 MG TABS tablet Take 1 tablet (20 mg total) by mouth daily with supper. 30 tablet 1  . sertraline (ZOLOFT) 100 MG tablet Take 1 tablet (100 mg total) by mouth daily. 90 tablet 3  . Turmeric (QC TUMERIC COMPLEX PO) Take by mouth.     No current facility-administered medications for this visit.     Patient confirms/reports the following allergies:  No Known Allergies  No orders of the defined types were placed in this encounter.   AUTHORIZATION INFORMATION Primary Insurance: 1D#: Group #:  Secondary Insurance: 1D#: Group #:  SCHEDULE INFORMATION: Date: Wed 08/08/19 Time: Location:[facility]

## 2019-07-31 NOTE — Telephone Encounter (Signed)
Pt left vm for Sharyn Lull he states he is supposed to go for his Covid test Friday but can not leave work until 2:30PM please call pt

## 2019-07-31 NOTE — Telephone Encounter (Signed)
Copied from [place] [phone]. Topic: General - Inquiry >> Jul 31, 2019  2:14 PM Berneta Levins wrote: Reason for CRM:   Sharyn Lull from [place] calling to see if his blood thinner request has been received. Sharyn Lull can be reached at [phone]

## 2019-07-31 NOTE — Patient Instructions (Signed)

## 2019-07-31 NOTE — Progress Notes (Signed)
Patient: Trevor Boyd Male    DOB: [birth year]   58 y.o.   MRN: [id number] Visit Date: 07/31/2019  Today's Provider: [name], PA-C   Chief Complaint  Patient presents with  . Follow-up   Subjective:    HPI  Follow-up Patient presents today for a follow-up for DVT. Patient was diagnosed with right popliteal DVT via ultrasound on 10.26.2020 after several days of right leg pain, swelling, and erythema. Patient states that the swelling has gone down and he is currently taking Rivaroxaban for treatment. He denies chest pain or SOB. He had 2 episodes of his heart fluttering three weeks ago but reports he felt his pulse at the time which was not fast. Denies continued leg pain. Denies fevers, chills, night sweats, unintentional weight loss, changes in stool. He is due for a colonoscopy as last colonoscopy in 2014 showed 3 mm polyp requiring follow up in 5 years. His PSA 01/2019 was normal. He denies history of previous blood clots. Denies family history of blood clots. Denies smoking.    No Known Allergies   Current Outpatient Medications:  .  rivaroxaban (XARELTO) 20 MG TABS tablet, Take 1 tablet (20 mg total) by mouth daily with supper., Disp: 30 tablet, Rfl: 1 .  sertraline (ZOLOFT) 100 MG tablet, Take 1 tablet (100 mg total) by mouth daily., Disp: 90 tablet, Rfl: 3 .  Turmeric (QC TUMERIC COMPLEX PO), Take by mouth., Disp: , Rfl:  .  methylPREDNISolone (MEDROL) 4 MG TBPK tablet, 6 day taper; take as directed on package instructions (Patient not taking: Reported on 07/31/2019), Disp: 21 tablet, Rfl: 0  Review of Systems  Social History   Tobacco Use  . Smoking status: Never Smoker  . Smokeless tobacco: Never Used  Substance Use Topics  . Alcohol use: No    Alcohol/week: 0.0 standard drinks      Objective:   BP 132/81 (BP Location: Left Arm, Patient Position: Sitting, Cuff Size: Normal)   Pulse 77   Temp (!) 96.8 F (36 C) (Temporal)   Wt 207 lb (93.9 kg)    BMI 27.31 kg/m  Vitals:   07/31/19 0959  BP: 132/81  Pulse: 77  Temp: (!) 96.8 F (36 C)  TempSrc: Temporal  Weight: 207 lb (93.9 kg)  Body mass index is 27.31 kg/m.   Physical Exam Constitutional:      Appearance: Normal appearance.  Cardiovascular:     Rate and Rhythm: Normal rate and regular rhythm.     Heart sounds: Normal heart sounds.  Pulmonary:     Effort: Pulmonary effort is normal. No respiratory distress.     Breath sounds: Normal breath sounds.  Musculoskeletal:     Right lower leg: No edema.     Left lower leg: No edema.     Comments: No redness or swelling of RLE. No pain in Deep venous system.   Skin:    General: Skin is warm and dry.  Neurological:     Mental Status: He is alert and oriented to person, place, and time. Mental status is at baseline.  Psychiatric:        Mood and Affect: Mood normal.        Behavior: Behavior normal.      No results found for any visits on 07/31/19.     Assessment & Plan    1. Acute deep vein thrombosis (DVT) of right popliteal vein (HCC)  Patient with unprovoked right lower extremity DVT.  Refer to hem/onc for further work up. Counseled he will need to be on Xarelto at least 3-6 months and potentially more pending any findings. He had two episodes of fluttering 3 weeks ago that resolved. No SOB, chest pain, Tachycardia today. Offered EKG but patient declines. Continue to observe and counseled on return precautions.   - Ambulatory referral to Hematology / Oncology  2. Colon cancer screening  Due for repeat colonoscopy. Referral as below.  - Ambulatory referral to Gastroenterology  The entirety of the information documented in the History of Present Illness, Review of Systems and Physical Exam were personally obtained by me. Portions of this information were initially documented by [facility], CMA and reviewed by me for thoroughness and accuracy.   F/u PRN      [name], PA-C  [facility]

## 2019-08-01 ENCOUNTER — Telehealth: Payer: Self-pay | Admitting: Gastroenterology

## 2019-08-01 ENCOUNTER — Telehealth: Payer: Self-pay

## 2019-08-01 ENCOUNTER — Other Ambulatory Visit: Payer: Self-pay

## 2019-08-01 MED ORDER — NA SULFATE-K SULFATE-MG SULF 17.5-3.13-1.6 GM/177ML PO SOLN: 1.0000 | Freq: Once | ORAL | 0 refills | Status: AC

## 2019-08-01 NOTE — Telephone Encounter (Signed)
Contacted Pre-Admit testing in regards to patients timing of COVID test.  Pre-Admit will close on Friday at [facility].  Caryl Pina said he can come in on Monday morning before 10am.  Patient has been advised to go for COVID test on Monday 08/06/19 for Colonoscopy Wed 08/08/19.  Thanks [employer]

## 2019-08-01 NOTE — Telephone Encounter (Signed)
Spoke directly to [employer], regarding blood thinner request for patient.  Trevor Boyd has advised on holding off on the colonoscopy at this time due to the time he has been on Xarelto.  She states that he had a new DVT, and would not advise interrupting Xarelto therapy as it is too soon.  She also said that patient has not been seen by the oncologist yet, and would like for him to be seen by the oncologist as well before having colonoscopy.  LVM for pt informing him of the colonoscopy cancellation, and also advised him do not have the COVID test, as he will not need it.  Thanks [employer]

## 2019-08-01 NOTE — Telephone Encounter (Signed)
Carles Collet PA called & l/m on v/m stating she did not know we were going to see patient that soon. She wants the patient see in Oncology first. She does not feel patient can come off the blood thinner at this time.Will send request back,but call with any questions.

## 2019-08-01 NOTE — Telephone Encounter (Signed)
The forms filled out by Royetta Crochet PA-C have been faxed to [facility] and should be scanned into the patients chart.

## 2019-08-06 NOTE — Telephone Encounter (Signed)
LVM with patient after I talked to [place] on Wed 25th to advise him that we will not move forward with th colonoscopy for reasons stated by [place].  We will call him back in February to try to schedule.  Thanks [employer]

## 2019-08-07 ENCOUNTER — Other Ambulatory Visit: Payer: Self-pay

## 2019-08-08 ENCOUNTER — Other Ambulatory Visit: Payer: Self-pay

## 2019-08-08 ENCOUNTER — Inpatient Hospital Stay: Payer: BC Managed Care – PPO | Attending: Oncology | Admitting: Oncology

## 2019-08-08 ENCOUNTER — Inpatient Hospital Stay: Payer: BC Managed Care – PPO

## 2019-08-08 ENCOUNTER — Encounter: Payer: Self-pay | Admitting: Oncology

## 2019-08-08 ENCOUNTER — Ambulatory Visit: Admit: 2019-08-08 | Payer: BC Managed Care – PPO | Admitting: Gastroenterology

## 2019-08-08 VITALS — BP 125/91 | HR 73 | Temp 97.8°F | Resp 18 | Ht 73.0 in | Wt 206.0 lb

## 2019-08-08 DIAGNOSIS — I82461 Acute embolism and thrombosis of right calf muscular vein: Secondary | ICD-10-CM | POA: Diagnosis not present

## 2019-08-08 DIAGNOSIS — I82431 Acute embolism and thrombosis of right popliteal vein: Secondary | ICD-10-CM | POA: Insufficient documentation

## 2019-08-08 DIAGNOSIS — Z7901 Long term (current) use of anticoagulants: Secondary | ICD-10-CM | POA: Diagnosis not present

## 2019-08-08 DIAGNOSIS — I82409 Acute embolism and thrombosis of unspecified deep veins of unspecified lower extremity: Secondary | ICD-10-CM | POA: Insufficient documentation

## 2019-08-08 HISTORY — DX: Acute embolism and thrombosis of unspecified deep veins of unspecified lower extremity: I82.409

## 2019-08-08 LAB — COMPREHENSIVE METABOLIC PANEL
ALT: 24 U/L (ref 0–44)
AST: 21 U/L (ref 15–41)
Albumin: 4.2 g/dL (ref 3.5–5.0)
Alkaline Phosphatase: 101 U/L (ref 38–126)
Anion gap: 4 — ABNORMAL LOW (ref 5–15)
BUN: 15 mg/dL (ref 6–20)
CO2: 25 mmol/L (ref 22–32)
Calcium: 8.9 mg/dL (ref 8.9–10.3)
Chloride: 104 mmol/L (ref 98–111)
Creatinine, Ser: 0.94 mg/dL (ref 0.61–1.24)
GFR calc Af Amer: >60 mL/min (ref >60)
GFR calc non Af Amer: >60 mL/min (ref >60)
Glucose, Bld: 97 mg/dL (ref 70–99)
Potassium: 4 mmol/L (ref 3.5–5.1)
Sodium: 133 mmol/L — ABNORMAL LOW (ref 135–145)
Total Bilirubin: 1.2 mg/dL (ref 0.3–1.2)
Total Protein: 7.2 g/dL (ref 6.5–8.1)

## 2019-08-08 LAB — CBC WITH DIFFERENTIAL/PLATELET
Abs Immature Granulocytes: 0.01 10*3/uL (ref 0.00–0.07)
Basophils Absolute: 0 10*3/uL (ref 0.0–0.1)
Basophils Relative: 1 %
Eosinophils Absolute: 0.1 10*3/uL (ref 0.0–0.5)
Eosinophils Relative: 2 %
HCT: 49.6 % (ref 39.0–52.0)
Hemoglobin: 16.3 g/dL (ref 13.0–17.0)
Immature Granulocytes: 0 %
Lymphocytes Relative: 23 %
Lymphs Abs: 1.3 10*3/uL (ref 0.7–4.0)
MCH: 29.5 pg (ref 26.0–34.0)
MCHC: 32.9 g/dL (ref 30.0–36.0)
MCV: 89.9 fL (ref 80.0–100.0)
Monocytes Absolute: 0.6 10*3/uL (ref 0.1–1.0)
Monocytes Relative: 11 %
Neutro Abs: 3.5 10*3/uL (ref 1.7–7.7)
Neutrophils Relative %: 63 %
Platelets: 256 10*3/uL (ref 150–400)
RBC: 5.52 MIL/uL (ref 4.22–5.81)
RDW: 12.1 % (ref 11.5–15.5)
WBC: 5.6 10*3/uL (ref 4.0–10.5)
nRBC: 0 % (ref 0.0–0.2)

## 2019-08-08 SURGERY — COLONOSCOPY WITH PROPOFOL
Anesthesia: General

## 2019-08-08 NOTE — Progress Notes (Signed)
Patient reports no concerns at this time

## 2019-08-09 NOTE — Progress Notes (Signed)
Hematology/Oncology Consult note [facility] [phone] [phone]   Patient Care Team: Paulene Floor as PCP - General (Physician Assistant)  REFERRING PROVIDER: [name], PA-C  CHIEF COMPLAINTS/REASON FOR VISIT:  Evaluation of management of lower extremity DVT.  HISTORY OF PRESENTING ILLNESS:  Trevor Boyd is a 58 y.o. male who was seen in consultation at the request of Pollak, Adriana M, PA-C for evaluation of DVT Patient was diagnosed with acute right lower extremity DVT on 07/02/2019. Ultrasound venous unilateral right lower extremity showed positive acute right distal popliteal DVT extending into the calf tibial and peroneal veins.  Immobilization factors: Patient reports onset of right lower extremity swelling and calf tenderness started after he had a 4-hour car drive to vacation. No prior injury.  He also felt some charley horse leg cramps of the same extremity prior to his car drive.   After he arrived destination, he went to sleep and woke up with right calf pain and some swelling.  Symptoms were progressively getting worse over the next few days.  Patient tried NSAIDs for the symptoms with minimal relief.  He came back and was seen by primary care provider who ordered ultrasound for further evaluation and got diagnosed. Patient was started on Xarelto for anticoagulation and symptom improved.  History of previous thrombosis event: Denies Family history of thrombosis: Denies  Age appropriate cancer screening: Patient reports that he is due for colonoscopy screening this year.  Due to Covid pandemic, he wants to wait until pandemic is over to do colonoscopy. Denies any blood in stool, unintentional weight loss, change of bowel habits Denies any shortness of breath today.  Review of Systems  Constitutional: Positive for fatigue. Negative for appetite change, chills, fever and unexpected weight change.  HENT:    Negative for hearing loss and voice change.   Eyes: Negative for eye problems and icterus.  Respiratory: Negative for chest tightness, cough and shortness of breath.   Cardiovascular: Negative for chest pain and leg swelling.  Gastrointestinal: Negative for abdominal distention and abdominal pain.  Endocrine: Negative for hot flashes.  Genitourinary: Negative for difficulty urinating, dysuria and frequency.   Musculoskeletal: Negative for arthralgias.  Skin: Negative for itching and rash.  Neurological: Negative for light-headedness and numbness.  Hematological: Negative for adenopathy. Does not bruise/bleed easily.  Psychiatric/Behavioral: Negative for confusion.    MEDICAL HISTORY:  Past Medical History:  Diagnosis Date  . DVT (deep venous thrombosis) ([place]) 08/08/2019   Right calf, behind knee, and right thigh  . Osteoarthritis of right knee     SURGICAL HISTORY: Past Surgical History:  Procedure Laterality Date  . COLONOSCOPY  07/20/2013   Dr. Arther Dames, [facility]; Impression: one 70mm polyp in the rectum. Lymphoid aggregate. Repeat 07/25/2018 Dr. Rayann Heman    SOCIAL HISTORY: Social History   Socioeconomic History  . Marital status: Married    Spouse name: Not on file  . Number of children: Not on file  . Years of education: Not on file  . Highest education level: Not on file  Occupational History  . Not on file  Social Needs  . Financial resource strain: Not on file  . Food insecurity    Worry: Not on file    Inability: Not on file  . Transportation needs    Medical: Not on file    Non-medical: Not on file  Tobacco Use  . Smoking status: Never Smoker  . Smokeless tobacco: Never Used  Substance and Sexual  Activity  . Alcohol use: No    Alcohol/week: 0.0 standard drinks  . Drug use: No  . Sexual activity: Yes    Partners: Female  Lifestyle  . Physical activity    Days per week: Not on file    Minutes per session: Not on file  . Stress: Not on file  Relationships   . Social Herbalist on phone: Not on file    Gets together: Not on file    Attends religious service: Not on file    Active member of club or organization: Not on file    Attends meetings of clubs or organizations: Not on file    Relationship status: Not on file  . Intimate partner violence    Fear of current or ex partner: Not on file    Emotionally abused: Not on file    Physically abused: Not on file    Forced sexual activity: Not on file  Other Topics Concern  . Not on file  Social History Narrative  . Not on file    FAMILY HISTORY: Family History  Problem Relation Age of Onset  . Hypertension Mother   . Hypertension Sister   . Heart attack Maternal Grandmother   . Heart attack Maternal Grandfather     ALLERGIES:  has No Known Allergies.  MEDICATIONS:  Current Outpatient Medications  Medication Sig Dispense Refill  . rivaroxaban (XARELTO) 20 MG TABS tablet Take 1 tablet (20 mg total) by mouth daily with supper. 30 tablet 1  . sertraline (ZOLOFT) 100 MG tablet Take 1 tablet (100 mg total) by mouth daily. 90 tablet 3  . Turmeric (QC TUMERIC COMPLEX PO) Take by mouth.    . methylPREDNISolone (MEDROL) 4 MG TBPK tablet 6 day taper; take as directed on package instructions (Patient not taking: Reported on 07/31/2019) 21 tablet 0   No current facility-administered medications for this visit.      PHYSICAL EXAMINATION: ECOG PERFORMANCE STATUS: 0 - Asymptomatic Vitals:   08/08/19 1104  BP: (!) 125/91  Pulse: 73  Resp: 18  Temp: 97.8 F (36.6 C)  SpO2: 97%   Filed Weights   08/08/19 1104  Weight: 206 lb (93.4 kg)    Physical Exam Constitutional:      General: He is not in acute distress. HENT:     Head: Normocephalic and atraumatic.  Eyes:     General: No scleral icterus.    Pupils: Pupils are equal, round, and reactive to light.  Neck:     Musculoskeletal: Normal range of motion and neck supple.  Cardiovascular:     Rate and Rhythm: Normal  rate and regular rhythm.     Heart sounds: Normal heart sounds.  Pulmonary:     Effort: Pulmonary effort is normal. No respiratory distress.     Breath sounds: No wheezing.  Abdominal:     General: Bowel sounds are normal. There is no distension.     Palpations: Abdomen is soft. There is no mass.     Tenderness: There is no abdominal tenderness.  Musculoskeletal: Normal range of motion.        General: No deformity.  Skin:    General: Skin is warm and dry.     Findings: No erythema or rash.  Neurological:     Mental Status: He is alert and oriented to person, place, and time.     Cranial Nerves: No cranial nerve deficit.     Coordination: Coordination normal.  Psychiatric:  Behavior: Behavior normal.        Thought Content: Thought content normal.     RADIOGRAPHIC STUDIES: I have personally reviewed the radiological images as listed and agreed with the findings in the report. Dg Knee Complete 4 Views Left  Result Date: 07/24/2019 CLINICAL DATA:  Pain status post fall EXAM: LEFT KNEE - COMPLETE 4+ VIEW COMPARISON:  10/17/2012 FINDINGS: There is no acute displaced fracture or dislocation. Tricompartmental mild-to-moderate osteoarthritis is noted, greatest within the lateral compartment. There is no significant joint effusion. IMPRESSION: 1. No acute bony abnormality. 2. Tricompartmental osteoarthritis, greatest within the lateral compartment. Electronically Signed   By: Constance Holster M.D.   On: 07/24/2019 20:08     LABORATORY DATA:  I have reviewed the data as listed Lab Results  Component Value Date   WBC 5.6 08/08/2019   HGB 16.3 08/08/2019   HCT 49.6 08/08/2019   MCV 89.9 08/08/2019   PLT 256 08/08/2019   Recent Labs    01/18/19 0846 08/08/19 1158  NA 137 133*  K 4.2 4.0  CL 102 104  CO2 21 25  GLUCOSE 120* 97  BUN 15 15  CREATININE 0.85 0.94  CALCIUM 9.5 8.9  GFRNONAA 97 >60  GFRAA 112 >60  PROT 6.4 7.2  ALBUMIN 4.2 4.2  AST 22 21  ALT 21 24   ALKPHOS 145* 101  BILITOT 0.8 1.2   Iron/TIBC/Ferritin/ %Sat No results found for: IRON, TIBC, FERRITIN, IRONPCTSAT     ASSESSMENT & PLAN:  1. Acute deep vein thrombosis (DVT) of calf muscle vein of right lower extremity (HCC)   2. Chronic anticoagulation    #Acute lower extremity distal DVT Per patient's history, this is most likely a provoked lower extremity DVT.  I recommend patient to continue full strength Xarelto 20 mg daily and completed at least total of 3 months of anticoagulation, given the nature of provoked DVT, he may come off Xarelto afterwards with close monitoring. I would obtain a right lower extremity ultrasound prior to the  discontinuation of anticoagulation.   Patient is clinically doing well and tolerating anticoagulation without any bleeding events.  Age-appropriate cancer screening: Patient is due for screening colonoscopy which was delayed due to being on anticoagulation. I discussed with patient that underlying cancer may increase risk of developing thrombosis.  I highly recommend patient to have all age-appropriate cancer screening done. Colonoscopy can be done after patient comes off Xarelto.  Follow-up in 2 months after ultrasound venous duplex of right lower extremity.  Orders Placed This Encounter  Procedures  . US Venous Img Lower Unilateral Right    Standing Status:   Future    Standing Expiration Date:   08/07/2020    Order Specific Question:   Reason for Exam (SYMPTOM  OR DIAGNOSIS REQUIRED)    Answer:   hx DVT    Order Specific Question:   Preferred imaging location?    Answer:   [facility]  . CBC with Differential/Platelet    Standing Status:   Future    Standing Expiration Date:   08/07/2020  . Comprehensive metabolic panel    Standing Status:   Future    Standing Expiration Date:   08/07/2020  . CBC with Differential/Platelet    Standing Status:   Future    Number of Occurrences:   1    Standing Expiration Date:   08/07/2020  .  Comprehensive metabolic panel    Standing Status:   Future    Number of Occurrences:  1    Standing Expiration Date:   08/07/2020    All questions were answered. The patient knows to call the clinic with any problems questions or concerns.  Cc [name], PA-C   Return of visit: 2 months Thank you for this kind referral and the opportunity to participate in the care of this patient. A copy of today's note is routed to referring provider  Total face to face encounter time for this patient visit was 45 min. >50% of the time was  spent in counseling and coordination of care.    Earlie Server, MD, PhD 08/09/2019

## 2019-09-28 ENCOUNTER — Telehealth: Payer: Self-pay | Admitting: Physician Assistant

## 2019-09-28 DIAGNOSIS — I82431 Acute embolism and thrombosis of right popliteal vein: Secondary | ICD-10-CM

## 2019-09-28 MED ORDER — RIVAROXABAN 20 MG PO TABS: 20.0000 mg | ORAL_TABLET | Freq: Every day | ORAL | 1 refills | Status: DC

## 2019-09-28 NOTE — Telephone Encounter (Signed)
Patient states that he is wanting to know if he could have the colonoscopy done if he hold off on his blood thinner medication (Xarelto) to have the colonoscopy. Patient states that he would like to come complete off of the Xarelto due not having the pain or blood clots. Please advise.

## 2019-09-28 NOTE — Telephone Encounter (Signed)
Patient was advised and verbalize he understood. Patient also wanted a refill request on his Xarelto and medication was send into pharmacy.

## 2019-09-28 NOTE — Telephone Encounter (Signed)
Patient called and would like to speak with Fabio Bering or her CMA about scheduling a colonoscopy. He also would like to talk about his medication rivaroxaban (XARELTO) 20 MG TABS tablet which he is not sure if he will continue taking or low the doze. Please return pt call back, thanks.

## 2019-09-28 NOTE — Addendum Note (Signed)
Addended by: Casimer Leek C on: 09/28/2019 11:36 AM   Modules accepted: Orders

## 2019-09-28 NOTE — Telephone Encounter (Signed)
Looks like oncology as of 08/2019 wanted him to stay on anticoagulation for at least 3 months and wanted an ultrasound prior to discontinuing. Would recommend seeing oncology first prior to scheduling a colonoscopy to get this done.

## 2019-10-10 ENCOUNTER — Ambulatory Visit
Admission: RE | Admit: 2019-10-10 | Discharge: 2019-10-10 | Disposition: A | Discharge status: Home / Self Care | Payer: BC Managed Care – PPO | Source: Ambulatory Visit | Attending: Oncology | Admitting: Oncology

## 2019-10-10 ENCOUNTER — Inpatient Hospital Stay: Payer: BC Managed Care – PPO | Attending: Oncology

## 2019-10-10 ENCOUNTER — Other Ambulatory Visit: Payer: Self-pay

## 2019-10-10 DIAGNOSIS — I82461 Acute embolism and thrombosis of right calf muscular vein: Secondary | ICD-10-CM | POA: Insufficient documentation

## 2019-10-10 DIAGNOSIS — I82431 Acute embolism and thrombosis of right popliteal vein: Secondary | ICD-10-CM | POA: Diagnosis not present

## 2019-10-10 DIAGNOSIS — Z7901 Long term (current) use of anticoagulants: Secondary | ICD-10-CM | POA: Diagnosis not present

## 2019-10-10 LAB — CBC WITH DIFFERENTIAL/PLATELET
Abs Immature Granulocytes: 0.01 10*3/uL (ref 0.00–0.07)
Basophils Absolute: 0 10*3/uL (ref 0.0–0.1)
Basophils Relative: 1 %
Eosinophils Absolute: 0.1 10*3/uL (ref 0.0–0.5)
Eosinophils Relative: 2 %
HCT: 47.8 % (ref 39.0–52.0)
Hemoglobin: 16 g/dL (ref 13.0–17.0)
Immature Granulocytes: 0 %
Lymphocytes Relative: 30 %
Lymphs Abs: 1.7 10*3/uL (ref 0.7–4.0)
MCH: 30.2 pg (ref 26.0–34.0)
MCHC: 33.5 g/dL (ref 30.0–36.0)
MCV: 90.2 fL (ref 80.0–100.0)
Monocytes Absolute: 0.5 10*3/uL (ref 0.1–1.0)
Monocytes Relative: 8 %
Neutro Abs: 3.2 10*3/uL (ref 1.7–7.7)
Neutrophils Relative %: 59 %
Platelets: 258 10*3/uL (ref 150–400)
RBC: 5.3 MIL/uL (ref 4.22–5.81)
RDW: 12.2 % (ref 11.5–15.5)
WBC: 5.5 10*3/uL (ref 4.0–10.5)
nRBC: 0 % (ref 0.0–0.2)

## 2019-10-10 LAB — COMPREHENSIVE METABOLIC PANEL
ALT: 18 U/L (ref 0–44)
AST: 21 U/L (ref 15–41)
Albumin: 4.1 g/dL (ref 3.5–5.0)
Alkaline Phosphatase: 112 U/L (ref 38–126)
Anion gap: 9 (ref 5–15)
BUN: 17 mg/dL (ref 6–20)
CO2: 23 mmol/L (ref 22–32)
Calcium: 8.8 mg/dL — ABNORMAL LOW (ref 8.9–10.3)
Chloride: 103 mmol/L (ref 98–111)
Creatinine, Ser: 1.01 mg/dL (ref 0.61–1.24)
GFR calc Af Amer: >60 mL/min (ref >60)
GFR calc non Af Amer: >60 mL/min (ref >60)
Glucose, Bld: 171 mg/dL — ABNORMAL HIGH (ref 70–99)
Potassium: 3.9 mmol/L (ref 3.5–5.1)
Sodium: 135 mmol/L (ref 135–145)
Total Bilirubin: 0.8 mg/dL (ref 0.3–1.2)
Total Protein: 6.8 g/dL (ref 6.5–8.1)

## 2019-10-12 ENCOUNTER — Inpatient Hospital Stay (HOSPITAL_BASED_OUTPATIENT_CLINIC_OR_DEPARTMENT_OTHER): Payer: BC Managed Care – PPO | Admitting: Oncology

## 2019-10-12 ENCOUNTER — Encounter: Payer: Self-pay | Admitting: Oncology

## 2019-10-12 DIAGNOSIS — Z86718 Personal history of other venous thrombosis and embolism: Secondary | ICD-10-CM

## 2019-10-12 NOTE — Progress Notes (Signed)
Patient verified using two identifiers for virtual visit via telephone today.  Patient does not offer any problems today.  

## 2019-10-12 NOTE — Progress Notes (Signed)
HEMATOLOGY-ONCOLOGY TeleHEALTH VISIT PROGRESS NOTE  I connected with Trevor Boyd on 10/12/19 at  2:00 PM EST by video enabled telemedicine visit and verified that I am speaking with the correct person using two identifiers. I discussed the limitations, risks, security and privacy concerns of performing an evaluation and management service by telemedicine and the availability of in-person appointments. I also discussed with the patient that there may be a patient responsible charge related to this service. The patient expressed understanding and agreed to proceed.   Other persons participating in the visit and their role in the encounter:  None  Patient's location:  Provider's location: office Chief Complaint: Follow-up for DVT   INTERVAL HISTORY Trevor Boyd is a 59 y.o. male who has above history reviewed by me today presents for follow up visit for management of DVT problems and complaints are listed below:  Patient has been on Xarelto  and has completed more than 3 months of anticoagulation.  Denies any bleeding events. Patient denies any new complaints today.  Review of Systems  Constitutional: Negative for appetite change, chills, fatigue, fever and unexpected weight change.  HENT:   Negative for hearing loss and voice change.   Eyes: Negative for eye problems and icterus.  Respiratory: Negative for chest tightness, cough and shortness of breath.   Cardiovascular: Negative for chest pain and leg swelling.  Gastrointestinal: Negative for abdominal distention and abdominal pain.  Endocrine: Negative for hot flashes.  Genitourinary: Negative for difficulty urinating, dysuria and frequency.   Musculoskeletal: Negative for arthralgias.  Skin: Negative for itching and rash.  Neurological: Negative for light-headedness and numbness.  Hematological: Negative for adenopathy. Does not bruise/bleed easily.  Psychiatric/Behavioral: Negative for confusion.    Past Medical History:  Diagnosis  Date  . DVT (deep venous thrombosis) ([place]) 08/08/2019   Right calf, behind knee, and right thigh  . Osteoarthritis of right knee    Past Surgical History:  Procedure Laterality Date  . COLONOSCOPY  07/20/2013   Dr. Arther Dames, [facility]; Impression: one 74mm polyp in the rectum. Lymphoid aggregate. Repeat 07/25/2018 Dr. Rayann Heman    Family History  Problem Relation Age of Onset  . Hypertension Mother   . Hypertension Sister   . Heart attack Maternal Grandmother   . Heart attack Maternal Grandfather     Social History   Socioeconomic History  . Marital status: Married    Spouse name: Not on file  . Number of children: Not on file  . Years of education: Not on file  . Highest education level: Not on file  Occupational History  . Not on file  Tobacco Use  . Smoking status: Never Smoker  . Smokeless tobacco: Never Used  Substance and Sexual Activity  . Alcohol use: No    Alcohol/week: 0.0 standard drinks  . Drug use: No  . Sexual activity: Yes    Partners: Female  Other Topics Concern  . Not on file  Social History Narrative  . Not on file   Social Determinants of Health   Financial Resource Strain:   . Difficulty of Paying Living Expenses: Not on file  Food Insecurity:   . Worried About Charity fundraiser in the Last Year: Not on file  . Ran Out of Food in the Last Year: Not on file  Transportation Needs:   . Lack of Transportation (Medical): Not on file  . Lack of Transportation (Non-Medical): Not on file  Physical Activity:   . Days of Exercise per  Week: Not on file  . Minutes of Exercise per Session: Not on file  Stress:   . Feeling of Stress : Not on file  Social Connections:   . Frequency of Communication with Friends and Family: Not on file  . Frequency of Social Gatherings with Friends and Family: Not on file  . Attends Religious Services: Not on file  . Active Member of Clubs or Organizations: Not on file  . Attends Archivist Meetings: Not on file   . Marital Status: Not on file  Intimate Partner Violence:   . Fear of Current or Ex-Partner: Not on file  . Emotionally Abused: Not on file  . Physically Abused: Not on file  . Sexually Abused: Not on file    Current Outpatient Medications on File Prior to Visit  Medication Sig Dispense Refill  . rivaroxaban (XARELTO) 20 MG TABS tablet Take 1 tablet (20 mg total) by mouth daily with supper. 30 tablet 1  . sertraline (ZOLOFT) 100 MG tablet Take 1 tablet (100 mg total) by mouth daily. 90 tablet 3  . Turmeric (QC TUMERIC COMPLEX PO) Take by mouth.     No current facility-administered medications on file prior to visit.    No Known Allergies     Observations/Objective: Today's Vitals   10/12/19 1355  PainSc: 0-No pain   There is no height or weight on file to calculate BMI.  Physical Exam  Constitutional: No distress.  Neurological: He is alert.    CBC    Component Value Date/Time   WBC 5.5 10/10/2019 1420   RBC 5.30 10/10/2019 1420   HGB 16.0 10/10/2019 1420   HGB 15.8 01/18/2019 0846   HCT 47.8 10/10/2019 1420   HCT 46.0 01/18/2019 0846   PLT 258 10/10/2019 1420   PLT 273 01/18/2019 0846   MCV 90.2 10/10/2019 1420   MCV 87 01/18/2019 0846   MCH 30.2 10/10/2019 1420   MCHC 33.5 10/10/2019 1420   RDW 12.2 10/10/2019 1420   RDW 11.8 01/18/2019 0846   LYMPHSABS 1.7 10/10/2019 1420   LYMPHSABS 1.0 01/18/2019 0846   MONOABS 0.5 10/10/2019 1420   EOSABS 0.1 10/10/2019 1420   EOSABS 0.1 01/18/2019 0846   BASOSABS 0.0 10/10/2019 1420   BASOSABS 0.0 01/18/2019 0846    CMP     Component Value Date/Time   NA 135 10/10/2019 1420   NA 137 01/18/2019 0846   K 3.9 10/10/2019 1420   CL 103 10/10/2019 1420   CO2 23 10/10/2019 1420   GLUCOSE 171 (H) 10/10/2019 1420   BUN 17 10/10/2019 1420   BUN 15 01/18/2019 0846   CREATININE 1.01 10/10/2019 1420   CALCIUM 8.8 (L) 10/10/2019 1420   PROT 6.8 10/10/2019 1420   PROT 6.4 01/18/2019 0846   ALBUMIN 4.1 10/10/2019 1420    ALBUMIN 4.2 01/18/2019 0846   AST 21 10/10/2019 1420   ALT 18 10/10/2019 1420   ALKPHOS 112 10/10/2019 1420   BILITOT 0.8 10/10/2019 1420   BILITOT 0.8 01/18/2019 0846   GFRNONAA >60 10/10/2019 1420   GFRAA >60 10/10/2019 1420    RADIOGRAPHIC STUDIES: I have personally reviewed the radiological images as listed and agreed with the findings in the report. US Venous Img Lower Unilateral Right  Result Date: 10/10/2019 CLINICAL DATA:  HISTORY OF DVT.  PAIN.  ANTICOAGULATION EXAM: RIGHT LOWER EXTREMITY VENOUS DOPPLER ULTRASOUND TECHNIQUE: Gray-scale sonography with graded compression, as well as color Doppler and duplex ultrasound were performed to evaluate the lower extremity  deep venous systems from the level of the common femoral vein and including the common femoral, femoral, profunda femoral, popliteal and calf veins including the posterior tibial, peroneal and gastrocnemius veins when visible. The superficial great saphenous vein was also interrogated. Spectral Doppler was utilized to evaluate flow at rest and with distal augmentation maneuvers in the common femoral, femoral and popliteal veins. COMPARISON:  07/02/2019 FINDINGS: Contralateral Common Femoral Vein: Respiratory phasicity is normal and symmetric with the symptomatic side. No evidence of thrombus. Normal compressibility. Common Femoral Vein: No evidence of thrombus. Normal compressibility, respiratory phasicity and response to augmentation. Saphenofemoral Junction: No evidence of thrombus. Normal compressibility and flow on color Doppler imaging. Profunda Femoral Vein: No evidence of thrombus. Normal compressibility and flow on color Doppler imaging. Femoral Vein: No evidence of thrombus. Normal compressibility, respiratory phasicity and response to augmentation. Popliteal Vein: No evidence of thrombus. Normal compressibility, respiratory phasicity and response to augmentation. previous popliteal dvt has resolved. Calf Veins: No evidence of  thrombus. Normal compressibility and flow on color Doppler imaging. IMPRESSION: Previous right popliteal DVT has resolved. No current significant right lower extremity DVT. Electronically Signed   By: Jerilynn Mages.  Shick M.D.   On: 10/10/2019 15:19   DG Knee Complete 4 Views Left  Result Date: 07/24/2019 CLINICAL DATA:  Pain status post fall EXAM: LEFT KNEE - COMPLETE 4+ VIEW COMPARISON:  10/17/2012 FINDINGS: There is no acute displaced fracture or dislocation. Tricompartmental mild-to-moderate osteoarthritis is noted, greatest within the lateral compartment. There is no significant joint effusion. IMPRESSION: 1. No acute bony abnormality. 2. Tricompartmental osteoarthritis, greatest within the lateral compartment. Electronically Signed   By: Constance Holster M.D.   On: 07/24/2019 20:08     Assessment and Plan: 1. History of deep vein thrombosis (DVT) of lower extremity     History of provoked lower extremity DVT. 10/10/2019 ultrasound venous duplex right lower extremity was reviewed and discussed with patient. Previous DVT has completely resolved. 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  No hypercoagulable work-up is needed for provoked DVT. He will be discharged from our clinic.  I am happy to see him again in the future if needed.  Recommend patient to continue follow-up with primary care provider.   Follow Up Instructions: No need for follow-up   I discussed the assessment and treatment plan with the patient. The patient was provided an opportunity to ask questions and all were answered. The patient agreed with the plan and demonstrated an understanding of the instructions.  The patient was advised to call back or seek an in-person evaluation if the symptoms worsen or if the condition fails to improve as anticipated.  Earlie Server, MD 10/12/2019 10:00 PM

## 2019-10-31 ENCOUNTER — Encounter: Payer: Self-pay | Admitting: Physician Assistant

## 2019-10-31 ENCOUNTER — Other Ambulatory Visit: Payer: Self-pay

## 2019-10-31 ENCOUNTER — Ambulatory Visit (INDEPENDENT_AMBULATORY_CARE_PROVIDER_SITE_OTHER): Payer: BC Managed Care – PPO | Admitting: Physician Assistant

## 2019-10-31 VITALS — BP 138/92 | HR 76 | Temp 96.9°F | Wt 210.0 lb

## 2019-10-31 DIAGNOSIS — N451 Epididymitis: Secondary | ICD-10-CM

## 2019-10-31 LAB — POCT URINALYSIS DIPSTICK
Bilirubin, UA: NEGATIVE
Blood, UA: NEGATIVE
Glucose, UA: NEGATIVE
Ketones, UA: NEGATIVE
Nitrite, UA: NEGATIVE
Protein, UA: NEGATIVE
Spec Grav, UA: 1.01 (ref 1.010–1.025)
Urobilinogen, UA: 0.2 E.U./dL
pH, UA: 7 (ref 5.0–8.0)

## 2019-10-31 MED ORDER — SULFAMETHOXAZOLE-TRIMETHOPRIM 800-160 MG PO TABS: 1.0000 | ORAL_TABLET | Freq: Two times a day (BID) | ORAL | 0 refills | Status: AC

## 2019-10-31 NOTE — Patient Instructions (Signed)
Orchitis  Orchitis is inflammation of a testicle. Testicles are the male organs that produce sperm. The testicles are held in a fleshy sac (scrotum) located behind the penis. Orchitis usually affects only one testicle, but it can affect both. Orchitis is caused by infection. Many kinds of bacteria and viruses can cause this infection. The condition can develop suddenly. What are the causes? This condition may be caused by:  Infection from viruses or bacteria.  Other organisms, such as fungi or parasites (rare). This is common in men who have a weak body defense system (immune system), such as men with HIV. Bacteria   Bacterial orchitis often occurs along with an infection of the tube that collects and stores sperm (epididymis).  In men who are not sexually active, this infection usually starts as a urinary tract infection and spreads to the testicle.  In sexually active men, sexually transmitted infections (STIs) are the most common cause of bacterial orchitis. These can include: ? Gonorrhea. ? Chlamydia. Viruses  Mumps is the most common cause of viral orchitis, though mumps is now rare in many areas because of vaccination.  Other viruses that can cause orchitis include: ? The chickenpox virus (varicella-zoster virus). ? The virus that causes mononucleosis (Epstein-Barr virus). What increases the risk? The following factors may make you more likely to develop this condition:  For viral orchitis: ? Not having been vaccinated against mumps.  For bacterial orchitis: ? Having had frequent urinary tract infections. ? Engaging in high-risk sexual behaviors, such as having multiple sexual partners or having sex without using a condom. ? Having a sexual partner with an STI. ? Having had urinary tract surgery. ? Using a tube that is passed through the penis to drain urine (Foley catheter). ? Having an enlarged prostate gland. What are the signs or symptoms? The most common symptoms of  orchitis are swelling and pain in the scrotum. Other signs and symptoms may include:  Feeling generally sick (malaise).  Fever and chills.  Painful urination.  Painful ejaculation.  Headache.  Fatigue.  Nausea.  Blood or discharge from the penis.  Swollen lymph nodes in the groin area (inguinal nodes). How is this diagnosed? This condition may be diagnosed based on:  Your symptoms. Your health care provider may suspect orchitis if you have a painful, swollen testicle along with other signs and symptoms of the condition.  A physical exam. You may also have other tests, including:  A blood test to check for signs of infection.  A urine test to check for a urinary tract infection or STI.  Using a swab to collect a fluid sample from the tip of the penis to test for STIs.  Taking an image of the testicle using sound waves and a computer (testicular ultrasound). How is this treated? Treatment for this condition depends on the cause.  For bacterial orchitis, your health care provider may prescribe antibiotic medicines. Bacterial infections usually clear up within a few days. For both viral infections and bacterial infections, you may be treated with:  Rest.  Anti-inflammatory medicines.  Pain medicines.  Raising (elevating) the scrotum with a towel or pillow underneath and applying ice. Follow these instructions at home:  Rest as directed by your health care provider.  Take over-the-counter and prescription medicines only as told by your health care provider.  If you were prescribed an antibiotic medicine, take it as told by your health care provider. Do not stop taking the antibiotic even if you start to feel better.  Do not have sex until your health care provider says it is okay to do so.  Elevate your scrotum and apply ice as directed: ? Put ice in a plastic bag. ? Place a small towel or pillow between your legs. ? Rest your scrotum on the pillow or  towel. ? Place another towel between your skin and the plastic bag. ? Leave the ice on for 20 minutes, 2-3 times a day.  Keep all follow-up visits as told by your health care provider. This is important. Contact a health care provider if:  You have a fever.  Pain and swelling have not gotten better after 3 days. Get help right away if:  Your pain is getting worse.  The swelling in your testicle gets worse. Summary  Orchitis is inflammation of a testicle. It is caused by an infection from bacteria or a virus.  The most common symptoms of orchitis are swelling and pain in the scrotum.  Treatment for this condition depends on the cause. It may include medicines to fight the infection, reduce inflammation, and relieve the pain.  Follow your health care provider's instructions about resting, icing, not having sex, and taking medicines. This information is not intended to replace advice given to you by your health care provider. Make sure you discuss any questions you have with your health care provider. Document Revised: 09/09/2017 Document Reviewed: 09/09/2017 Elsevier Patient Education  [place].

## 2019-10-31 NOTE — Progress Notes (Signed)
Patient: Trevor Boyd Male    DOB: Jul 18, 1961   59 y.o.   MRN: [id number] Visit Date: 10/31/2019  Today's Provider: [name], PA-C   Chief Complaint  Patient presents with  . Testicle Pain   Subjective:     Testicle Pain The patient's primary symptoms include testicular pain. This is a new problem. The current episode started today. The problem occurs constantly. The problem has been unchanged. The pain is moderate. Pertinent negatives include no abdominal pain or shortness of breath.   Reports today he had left testicular pain and sensitivity this morning ongoing for several hours. The week prior he had hurt his left knee due to mechanical injury and is unsure if this is related. Rates testicular pain 6/10. Denies nausea or vomiting, back pain or abdominal pain. Denies fevers, chills, dysuria, new sexual partners, penile discharge. Reports right testicle is unaffected. No recent catheterization or instrumentation.   No Known Allergies   Current Outpatient Medications:  .  sertraline (ZOLOFT) 100 MG tablet, Take 1 tablet (100 mg total) by mouth daily., Disp: 90 tablet, Rfl: 3 .  Turmeric (QC TUMERIC COMPLEX PO), Take by mouth., Disp: , Rfl:   Review of Systems  Respiratory: Negative for shortness of breath.   Gastrointestinal: Negative for abdominal pain.  Genitourinary: Positive for testicular pain.    Social History   Tobacco Use  . Smoking status: Never Smoker  . Smokeless tobacco: Never Used  Substance Use Topics  . Alcohol use: No    Alcohol/week: 0.0 standard drinks      Objective:   BP (!) 138/92 (BP Location: Left Arm, Patient Position: Sitting, Cuff Size: Normal)   Pulse 76   Temp (!) 96.9 F (36.1 C) (Temporal)   Wt 210 lb (95.3 kg)   BMI 27.71 kg/m  Vitals:   10/31/19 1337  BP: (!) 138/92  Pulse: 76  Temp: (!) 96.9 F (36.1 C)  TempSrc: Temporal  Weight: 210 lb (95.3 kg)  Body mass index is 27.71 kg/m.   Physical  Exam Constitutional:      General: He is not in acute distress.    Appearance: Normal appearance. He is not ill-appearing or toxic-appearing.  Genitourinary:    Penis: Normal and circumcised.      Testes:        Right: Mass, tenderness, swelling, testicular hydrocele or varicocele not present. Right testis is descended. Cremasteric reflex is present.         Left: Tenderness and swelling present. Mass, testicular hydrocele or varicocele not present. Left testis is descended. Cremasteric reflex is present.      Epididymis:     Right: Normal.     Left: Enlarged. Tenderness present. No mass.  Skin:    General: Skin is warm and dry.  Neurological:     Mental Status: He is alert and oriented to person, place, and time. Mental status is at baseline.  Psychiatric:        Mood and Affect: Mood normal.        Behavior: Behavior normal.      No results found for any visits on 10/31/19.     Assessment & Plan    1. Epididymitis  Treat as below and send for culture. Advised to use ice and anti-inflammatories like ibuprofen. Follow up if worsening.   - sulfamethoxazole-trimethoprim (BACTRIM DS) 800-160 MG tablet; Take 1 tablet by mouth 2 (two) times daily for 10 days.  Dispense: 20 tablet;  Refill: 0 - POCT Urinalysis Dipstick - CULTURE, URINE COMPREHENSIVE  The entirety of the information documented in the History of Present Illness, Review of Systems and Physical Exam were personally obtained by me. Portions of this information were initially documented by [facility] and reviewed by me for thoroughness and accuracy.   F/u PRN    [name], PA-C  [facility]

## 2019-11-04 LAB — CULTURE, URINE COMPREHENSIVE

## 2020-02-18 ENCOUNTER — Encounter: Payer: Self-pay | Admitting: Physician Assistant

## 2020-02-18 ENCOUNTER — Telehealth: Payer: Self-pay

## 2020-02-18 DIAGNOSIS — Z1211 Encounter for screening for malignant neoplasm of colon: Secondary | ICD-10-CM

## 2020-02-18 NOTE — Telephone Encounter (Signed)
I looked in care everywhere and didn't see anything

## 2020-02-18 NOTE — Addendum Note (Signed)
Addended by: Mar Daring on: 02/18/2020 02:27 PM   Modules accepted: Orders

## 2020-02-18 NOTE — Addendum Note (Signed)
Addended by: Doristine Devoid on: 02/18/2020 01:57 PM   Modules accepted: Orders

## 2020-02-18 NOTE — Telephone Encounter (Signed)
Referral placed.

## 2020-02-18 NOTE — Telephone Encounter (Signed)
Copied from [facility] [id number]. Topic: Referral - Request for Referral >> Feb 18, 2020 12:13 PM Greggory Keen D wrote: Pt called asking if Fabio Bering would give a referral for him to have a screening colonoscopy.  He thinks it is time for him to have one.  He has had one but he can not remember who it was with.  CB#  [phone]

## 2020-02-21 ENCOUNTER — Telehealth (INDEPENDENT_AMBULATORY_CARE_PROVIDER_SITE_OTHER): Payer: Self-pay | Admitting: Gastroenterology

## 2020-02-21 ENCOUNTER — Other Ambulatory Visit (INDEPENDENT_AMBULATORY_CARE_PROVIDER_SITE_OTHER): Payer: Self-pay

## 2020-02-21 DIAGNOSIS — Z1211 Encounter for screening for malignant neoplasm of colon: Secondary | ICD-10-CM

## 2020-02-21 NOTE — Progress Notes (Signed)
Gastroenterology Pre-Procedure Review  Request Date: fRIDAY 04/11/20 Requesting Physician: Dr. Vicente Males  PATIENT REVIEW QUESTIONS: The patient responded to the following health history questions as indicated:    1. Are you having any GI issues? no 2. Do you have a personal history of Polyps? no 3. Do you have a family history of Colon Cancer or Polyps? no 4. Diabetes Mellitus? no 5. Joint replacements in the past 12 months?no 6. Major health problems in the past 3 months?no 7. Any artificial heart valves, MVP, or defibrillator?no    MEDICATIONS & ALLERGIES:    Patient reports the following regarding taking any anticoagulation/antiplatelet therapy:   Plavix, Coumadin, Eliquis, Xarelto, Lovenox, Pradaxa, Brilinta, or Effient? no Aspirin? no  Patient confirms/reports the following medications:  Current Outpatient Medications  Medication Sig Dispense Refill  . sertraline (ZOLOFT) 100 MG tablet Take 1 tablet (100 mg total) by mouth daily. 90 tablet 3  . Turmeric (QC TUMERIC COMPLEX PO) Take by mouth.     No current facility-administered medications for this visit.    Patient confirms/reports the following allergies:  No Known Allergies  No orders of the defined types were placed in this encounter.   AUTHORIZATION INFORMATION Primary Insurance: 1D#: Group #:  Secondary Insurance: 1D#: Group #:  SCHEDULE INFORMATION: Date: Friday 04/11/20 Time: Location:[facility]

## 2020-04-09 ENCOUNTER — Other Ambulatory Visit
Admission: RE | Admit: 2020-04-09 | Discharge: 2020-04-09 | Disposition: A | Discharge status: Home / Self Care | Payer: BC Managed Care – PPO | Source: Ambulatory Visit | Attending: Gastroenterology | Admitting: Gastroenterology

## 2020-04-09 ENCOUNTER — Other Ambulatory Visit: Payer: Self-pay

## 2020-04-09 DIAGNOSIS — Z20822 Contact with and (suspected) exposure to covid-19: Secondary | ICD-10-CM | POA: Diagnosis not present

## 2020-04-09 DIAGNOSIS — Z01812 Encounter for preprocedural laboratory examination: Secondary | ICD-10-CM | POA: Diagnosis not present

## 2020-04-09 LAB — SARS CORONAVIRUS 2 (TAT 6-24 HRS): SARS Coronavirus 2: NEGATIVE

## 2020-04-11 ENCOUNTER — Encounter
Admission: RE | Disposition: A | Discharge status: Home / Self Care | Payer: Self-pay | Source: Home / Self Care | Attending: Gastroenterology

## 2020-04-11 ENCOUNTER — Ambulatory Visit: Payer: BC Managed Care – PPO | Admitting: Certified Registered Nurse Anesthetist

## 2020-04-11 ENCOUNTER — Other Ambulatory Visit: Payer: Self-pay

## 2020-04-11 ENCOUNTER — Encounter: Payer: Self-pay | Admitting: Gastroenterology

## 2020-04-11 ENCOUNTER — Ambulatory Visit
Admission: RE | Admit: 2020-04-11 | Discharge: 2020-04-11 | Disposition: A | Discharge status: Home / Self Care | Payer: BC Managed Care – PPO | Attending: Gastroenterology | Admitting: Gastroenterology

## 2020-04-11 DIAGNOSIS — F329 Major depressive disorder, single episode, unspecified: Secondary | ICD-10-CM | POA: Insufficient documentation

## 2020-04-11 DIAGNOSIS — Z79899 Other long term (current) drug therapy: Secondary | ICD-10-CM | POA: Insufficient documentation

## 2020-04-11 DIAGNOSIS — K635 Polyp of colon: Secondary | ICD-10-CM

## 2020-04-11 DIAGNOSIS — M1711 Unilateral primary osteoarthritis, right knee: Secondary | ICD-10-CM | POA: Diagnosis not present

## 2020-04-11 DIAGNOSIS — Z86718 Personal history of other venous thrombosis and embolism: Secondary | ICD-10-CM | POA: Diagnosis not present

## 2020-04-11 DIAGNOSIS — Z1211 Encounter for screening for malignant neoplasm of colon: Secondary | ICD-10-CM | POA: Diagnosis not present

## 2020-04-11 DIAGNOSIS — D124 Benign neoplasm of descending colon: Secondary | ICD-10-CM | POA: Insufficient documentation

## 2020-04-11 HISTORY — DX: Depression, unspecified: F32.A

## 2020-04-11 HISTORY — PX: COLONOSCOPY WITH PROPOFOL: SHX5780

## 2020-04-11 SURGERY — COLONOSCOPY WITH PROPOFOL
Anesthesia: General

## 2020-04-11 MED ORDER — PROPOFOL 10 MG/ML IV BOLUS
INTRAVENOUS | Status: DC | PRN
  Administered 2020-04-11: 25 mg via INTRAVENOUS
  Administered 2020-04-11: 80 mg via INTRAVENOUS

## 2020-04-11 MED ORDER — LIDOCAINE HCL (PF) 2 % IJ SOLN
INTRAMUSCULAR | Status: AC
  Filled 2020-04-11: qty 5

## 2020-04-11 MED ORDER — SODIUM CHLORIDE 0.9 % IV SOLN: INTRAVENOUS | Status: DC

## 2020-04-11 MED ORDER — PROPOFOL 500 MG/50ML IV EMUL
INTRAVENOUS | Status: AC
  Filled 2020-04-11: qty 50

## 2020-04-11 MED ORDER — LIDOCAINE HCL (CARDIAC) PF 100 MG/5ML IV SOSY
PREFILLED_SYRINGE | INTRAVENOUS | Status: DC | PRN
  Administered 2020-04-11: 50 mg via INTRAVENOUS

## 2020-04-11 MED ORDER — PROPOFOL 500 MG/50ML IV EMUL
INTRAVENOUS | Status: DC | PRN
  Administered 2020-04-11: 130 ug/kg/min via INTRAVENOUS

## 2020-04-11 NOTE — H&P (Signed)
Vonda Antigua, MD [address] [place], [place], [place], [address] [address], [place], [place], [place], [address] Phone: [phone]  Fax: [phone]  Primary Care Physician:  [name], PA-C   Pre-Procedure History & Physical: HPI:  Trevor Boyd is a 59 y.o. male is here for a colonoscopy.   Past Medical History:  Diagnosis Date  . Depression   . DVT (deep venous thrombosis) ([place]) 08/08/2019   Right calf, behind knee, and right thigh  . Osteoarthritis of right knee     Past Surgical History:  Procedure Laterality Date  . COLONOSCOPY  07/20/2013   Dr. Arther Dames, [facility]; Impression: one 30mm polyp in the rectum. Lymphoid aggregate. Repeat 07/25/2018 Dr. Rayann Heman    Prior to Admission medications   Medication Sig Start Date End Date Taking? Authorizing Provider  sertraline (ZOLOFT) 100 MG tablet Take 1 tablet (100 mg total) by mouth daily. 04/23/19  Yes Pollak, Adriana M, PA-C  Turmeric (QC TUMERIC COMPLEX PO) Take by mouth. Patient not taking: Reported on 04/11/2020    [provider]    Allergies as of 02/21/2020  . (No Known Allergies)    Family History  Problem Relation Age of Onset  . Hypertension Mother   . Hypertension Sister   . Heart attack Maternal Grandmother   . Heart attack Maternal Grandfather     Social History   Socioeconomic History  . Marital status: Married    Spouse name: Not on file  . Number of children: Not on file  . Years of education: Not on file  . Highest education level: Not on file  Occupational History  . Not on file  Tobacco Use  . Smoking status: Never Smoker  . Smokeless tobacco: Never Used  Substance and Sexual Activity  . Alcohol use: No    Alcohol/week: 0.0 standard drinks  . Drug use: No  . Sexual activity: Yes    Partners: Female  Other Topics Concern  . Not on file  Social History Narrative  . Not on file   Social Determinants of Health   Financial Resource Strain:   . Difficulty of  Paying Living Expenses:   Food Insecurity:   . Worried About Charity fundraiser in the Last Year:   . Arboriculturist in the Last Year:   Transportation Needs:   . Film/video editor (Medical):   Marland Kitchen Lack of Transportation (Non-Medical):   Physical Activity:   . Days of Exercise per Week:   . Minutes of Exercise per Session:   Stress:   . Feeling of Stress :   Social Connections:   . Frequency of Communication with Friends and Family:   . Frequency of Social Gatherings with Friends and Family:   . Attends Religious Services:   . Active Member of Clubs or Organizations:   . Attends Archivist Meetings:   Marland Kitchen Marital Status:   Intimate Partner Violence:   . Fear of Current or Ex-Partner:   . Emotionally Abused:   Marland Kitchen Physically Abused:   . Sexually Abused:     Review of Systems: See HPI, otherwise negative ROS  Physical Exam: BP (!) 125/96   Pulse 68   Temp (!) 97.1 F (36.2 C) (Temporal)   Resp 16   Ht 6\' 1"  (1.854 m)   Wt 99.8 kg   SpO2 96%   BMI 29.03 kg/m  General:   Alert,  pleasant and cooperative in NAD Head:  Normocephalic and  atraumatic. Neck:  Supple; no masses or thyromegaly. Lungs:  Clear throughout to auscultation, normal respiratory effort.    Heart:  +S1, +S2, Regular rate and rhythm, No edema. Abdomen:  Soft, nontender and nondistended. Normal bowel sounds, without guarding, and without rebound.   Neurologic:  Alert and  oriented x4;  grossly normal neurologically.  Impression/Plan: LEGEND TUMMINELLO is here for a colonoscopy to be performed for average risk screening.  Risks, benefits, limitations, and alternatives regarding  colonoscopy have been reviewed with the patient.  Questions have been answered.  All parties agreeable.   Virgel Manifold, MD  04/11/2020, 8:05 AM

## 2020-04-11 NOTE — Transfer of Care (Signed)
Immediate Anesthesia Transfer of Care Note  Patient: [name]  Procedure(s) Performed: COLONOSCOPY WITH PROPOFOL (N/A )  Patient Location: PACU and Endoscopy Unit  Anesthesia Type:General  Level of Consciousness: drowsy  Airway & Oxygen Therapy: Patient Spontanous Breathing and Patient connected to nasal cannula oxygen  Post-op Assessment: Report given to RN and Post -op Vital signs reviewed and stable  Post vital signs: Reviewed and stable  Last Vitals:  Vitals Value Taken Time  BP 96/62 04/11/20 0841  Temp    Pulse 67 04/11/20 0841  Resp 15 04/11/20 0841  SpO2 95 % 04/11/20 0841  Vitals shown include unvalidated device data.  Last Pain:  Vitals:   04/11/20 0730  TempSrc: Temporal  PainSc: 0-No pain         Complications: No complications documented.

## 2020-04-11 NOTE — Op Note (Signed)
[facility] Patient Name: Trevor Boyd Procedure Date: 04/11/2020 7:47 AM MRN: [id number] Account #: [id number] Date of Birth: [birth year] Admit Type: Outpatient Age: 59 Room: [facility] ENDO ROOM 4 Gender: Male Note Status: Finalized Procedure:             Colonoscopy Indications:           Screening for colorectal malignant neoplasm Providers:             Kameelah Minish B. Bonna Gains MD, MD Referring MD:          Wendee Beavers. Terrilee Croak (Referring MD) Medicines:             Monitored Anesthesia Care Complications:         No immediate complications. Procedure:             Pre-Anesthesia Assessment:                        - ASA Grade Assessment: II - A patient with mild                         systemic disease.                        - Prior to the procedure, a History and Physical was                         performed, and patient medications, allergies and                         sensitivities were reviewed. The patient's tolerance                         of previous anesthesia was reviewed.                        - The risks and benefits of the procedure and the                         sedation options and risks were discussed with the                         patient. All questions were answered and informed                         consent was obtained.                        - Patient identification and proposed procedure were                         verified prior to the procedure by the physician, the                         nurse, the anesthesiologist, the anesthetist and the                         technician. The procedure was verified in the                         procedure room.  After obtaining informed consent, the colonoscope was                         passed under direct vision. Throughout the procedure,                         the patient's blood pressure, pulse, and oxygen                         saturations were monitored  continuously. The                         Colonoscope was introduced through the anus and                         advanced to the the cecum, identified by appendiceal                         orifice and ileocecal valve. The colonoscopy was                         performed with ease. The patient tolerated the                         procedure well. The quality of the bowel preparation                         was good except the ascending colon was fair and the                         cecum was fair. Water jet and suction was used to                         clean. Findings:      The perianal and digital rectal examinations were normal.      A 4 mm polyp was found in the descending colon. The polyp was sessile.       The polyp was removed with a jumbo cold forceps. Resection and retrieval       were complete.      The exam was otherwise without abnormality.      The rectum, sigmoid colon, descending colon, transverse colon, ascending       colon and cecum appeared normal.      The retroflexed view of the distal rectum and anal verge was normal and       showed no anal or rectal abnormalities. Impression:            - One 4 mm polyp in the descending colon, removed with                         a jumbo cold forceps. Resected and retrieved.                        - The examination was otherwise normal.                        - The rectum, sigmoid colon, descending colon,  transverse colon, ascending colon and cecum are normal.                        - The distal rectum and anal verge are normal on                         retroflexion view. Recommendation:        - Await pathology results.                        - Discharge patient to home (with escort).                        - Advance diet as tolerated.                        - Continue present medications.                        - Repeat colonoscopy in 1 year, with 2 day prep.                        - The findings  and recommendations were discussed with                         the patient.                        - The findings and recommendations were discussed with                         the patient's family.                        - Return to primary care physician as previously                         scheduled. Procedure Code(s):     --- Professional ---                        [phone], Colonoscopy, flexible; with biopsy, single or                         multiple Diagnosis Code(s):     --- Professional ---                        Z12.11, Encounter for screening for malignant neoplasm                         of colon                        K63.5, Polyp of colon CPT copyright 2019 American Medical Association. All rights reserved. The codes documented in this report are preliminary and upon coder review may  be revised to meet current compliance requirements.  Vonda Antigua, MD Margretta Sidle B. Bonna Gains MD, MD 04/11/2020 8:52:49 AM This report has been signed electronically. Number of Addenda: 0 Note Initiated On: 04/11/2020 7:47 AM Scope Withdrawal Time: 0 hours 18 minutes 50 seconds  Total Procedure Duration: 0 hours 23 minutes  26 seconds  Estimated Blood Loss:  Estimated blood loss: none.      [facility]

## 2020-04-11 NOTE — Anesthesia Preprocedure Evaluation (Signed)
Anesthesia Evaluation  Patient identified by MRN, date of birth, ID band Patient awake    Reviewed: Allergy & Precautions, H&P , NPO status , Patient's Chart, lab work & pertinent test results, reviewed documented beta blocker date and time   History of Anesthesia Complications Negative for: history of anesthetic complications  Airway Mallampati: I  TM Distance: >3 FB Neck ROM: full    Dental  (+) Dental Advidsory Given, Caps, Teeth Intact, Missing   Pulmonary neg pulmonary ROS,    Pulmonary exam normal breath sounds clear to auscultation       Cardiovascular Exercise Tolerance: Good negative cardio ROS Normal cardiovascular exam Rhythm:regular Rate:Normal     Neuro/Psych PSYCHIATRIC DISORDERS Depression negative neurological ROS     GI/Hepatic negative GI ROS, Neg liver ROS,   Endo/Other  negative endocrine ROS  Renal/GU negative Renal ROS  negative genitourinary   Musculoskeletal   Abdominal   Peds  Hematology negative hematology ROS (+)   Anesthesia Other Findings Past Medical History: No date: Depression 08/08/2019: DVT (deep venous thrombosis) (HCC)     Comment:  Right calf, behind knee, and right thigh No date: Osteoarthritis of right knee   Reproductive/Obstetrics negative OB ROS                             Anesthesia Physical Anesthesia Plan  ASA: II  Anesthesia Plan: General   Post-op Pain Management:    Induction: Intravenous  PONV Risk Score and Plan: 2 and Propofol infusion and TIVA  Airway Management Planned: Natural Airway and Nasal Cannula  Additional Equipment:   Intra-op Plan:   Post-operative Plan:   Informed Consent: I have reviewed the patients History and Physical, chart, labs and discussed the procedure including the risks, benefits and alternatives for the proposed anesthesia with the patient or authorized representative who has indicated his/her  understanding and acceptance.     Dental Advisory Given  Plan Discussed with: Anesthesiologist, CRNA and Surgeon  Anesthesia Plan Comments:         Anesthesia Quick Evaluation

## 2020-04-11 NOTE — Anesthesia Postprocedure Evaluation (Signed)
Anesthesia Post Note  Patient: KEIGHAN AMEZCUA  Procedure(s) Performed: COLONOSCOPY WITH PROPOFOL (N/A )  Patient location during evaluation: Endoscopy Anesthesia Type: General Level of consciousness: awake and alert Pain management: pain level controlled Vital Signs Assessment: post-procedure vital signs reviewed and stable Respiratory status: spontaneous breathing, nonlabored ventilation, respiratory function stable and patient connected to nasal cannula oxygen Cardiovascular status: blood pressure returned to baseline and stable Postop Assessment: no apparent nausea or vomiting Anesthetic complications: no   No complications documented.   Last Vitals:  Vitals:   04/11/20 0909 04/11/20 0911  BP:    Pulse: (!) 56 61  Resp: 18 (!) 24  Temp:    SpO2: 94% 97%    Last Pain:  Vitals:   04/11/20 0840  TempSrc: Temporal  PainSc:                  Martha Clan

## 2020-04-12 NOTE — Progress Notes (Addendum)
   04/11/20 0730  Clinical Encounter Type  Visited With Family  Visit Type Initial  Referral From Chaplain  Consult/Referral To Chaplain  While rounding the [place] waiting area, chaplain briefly spoke with patient's wife member, making sure she was fine while waiting. Patient's wife said she was ok and chaplain wished her well.

## 2020-04-14 ENCOUNTER — Encounter: Payer: Self-pay | Admitting: Gastroenterology

## 2020-04-14 LAB — SURGICAL PATHOLOGY

## 2020-04-22 ENCOUNTER — Encounter: Payer: Self-pay | Admitting: Gastroenterology

## 2020-05-29 ENCOUNTER — Encounter: Payer: Self-pay | Admitting: Physician Assistant

## 2020-05-29 ENCOUNTER — Other Ambulatory Visit: Payer: Self-pay | Admitting: Physician Assistant

## 2020-05-29 ENCOUNTER — Other Ambulatory Visit: Payer: Self-pay

## 2020-05-29 ENCOUNTER — Ambulatory Visit (INDEPENDENT_AMBULATORY_CARE_PROVIDER_SITE_OTHER): Payer: BC Managed Care – PPO | Admitting: Physician Assistant

## 2020-05-29 VITALS — BP 124/81 | HR 84 | Temp 98.6°F | Wt 211.6 lb

## 2020-05-29 DIAGNOSIS — F439 Reaction to severe stress, unspecified: Secondary | ICD-10-CM | POA: Diagnosis not present

## 2020-05-29 MED ORDER — SERTRALINE HCL 50 MG PO TABS: 50.0000 mg | ORAL_TABLET | Freq: Every day | ORAL | 3 refills | Status: DC

## 2020-05-29 NOTE — Telephone Encounter (Signed)
Refill request for sertraline; last refill 02/10/20; no valid encounter within the last 6 months; no upcoming visits noted; 30 day courtesy refill granted to cover pt until appt.patient notified; decision tree completed; pt offered and accepted appt with Carles Collet, [place], 05/29/20 at [facility]; will route to office for notification.

## 2020-05-29 NOTE — Telephone Encounter (Signed)
Requested Prescriptions  Pending Prescriptions Disp Refills   sertraline (ZOLOFT) 100 MG tablet [Pharmacy Med Name: SERTRALINE 100MG  TABLETS] 30 tablet 0    Sig: TAKE 1 TABLET(100 MG) BY MOUTH DAILY     Psychiatry:  Antidepressants - SSRI Failed - 05/29/2020 11:42 AM      Failed - Valid encounter within last 6 months    Recent Outpatient Visits          7 months ago [place], [place], PA-C   10 months ago Acute deep vein thrombosis (DVT) of right popliteal vein [facility])   [place], [place], PA-C   10 months ago Sprain of medial collateral ligament of left knee, initial encounter   [place], [place]   11 months ago Tenderness of right calf   [facility] [place], Dionne Bucy, MD   1 year ago Bursitis of right knee, unspecified bursa   [facility] [place], [place], [place]

## 2020-05-29 NOTE — Progress Notes (Signed)
Established patient visit   Patient: Trevor Boyd   DOB: [birth year]   59 y.o. Male  MRN: [id number] Visit Date: 05/29/2020  Today's healthcare provider: [name], PA-C   Chief Complaint  Patient presents with  . Follow-up   Subjective    HPI   Patient presents today in office for a medication refill/ follow up for situational stress. Patient currently takes Sertraline 100 MG, patient says that is still working well for him and that he is in good compliance with medication.  Anxiety, Follow-up  He was last seen for anxiety 1 months ago. Changes made at last visit include continue zoloft.   He reports excellent compliance with treatment. He reports excellent tolerance of treatment. He is not having side effects.   He feels his anxiety is mild and Improved since last visit. Patient would like to reduce his zoloft as he feels his work environment has improved.   Symptoms: No chest pain No difficulty concentrating  No dizziness No fatigue  No feelings of losing control No insomnia  No irritable No palpitations  No panic attacks No racing thoughts  No shortness of breath No sweating  No tremors/shakes    GAD-7 Results No flowsheet data found.  PHQ-9 Scores PHQ9 SCORE ONLY 05/29/2020 05/29/2020 03/07/2019  PHQ-9 Total Score 0 0 0    ---------------------------------------------------------------------------------------------------      Medications: Outpatient Medications Prior to Visit  Medication Sig  . [DISCONTINUED] sertraline (ZOLOFT) 100 MG tablet TAKE 1 TABLET(100 MG) BY MOUTH DAILY  . Turmeric (QC TUMERIC COMPLEX PO) Take by mouth. (Patient not taking: Reported on 04/11/2020)   No facility-administered medications prior to visit.    Review of Systems    Objective    BP 124/81 (BP Location: Right Arm, Patient Position: Sitting, Cuff Size: Large)   Pulse 84   Temp 98.6 F (37 C) (Oral)   Wt 211 lb 9.6 oz (96 kg)   BMI 27.92 kg/m     Physical Exam Constitutional:      Appearance: Normal appearance.  Cardiovascular:     Rate and Rhythm: Normal rate and regular rhythm.     Heart sounds: Normal heart sounds.  Pulmonary:     Effort: Pulmonary effort is normal.     Breath sounds: Normal breath sounds.  Skin:    General: Skin is warm and dry.  Neurological:     Mental Status: He is alert and oriented to person, place, and time. Mental status is at baseline.  Psychiatric:        Mood and Affect: Mood normal.        Behavior: Behavior normal.       No results found for any visits on 05/29/20.  Assessment & Plan    1. Situational stress  Reduce zoloft by 25 mg every two weeks if he would like to discontinue completely, or until the dosage is acceptable to him.   - sertraline (ZOLOFT) 50 MG tablet; Take 1 tablet (50 mg total) by mouth daily.  Dispense: 90 tablet; Refill: 3    No follow-ups on file.      ITrinna Post, PA-C, have reviewed all documentation for this visit. The documentation on 05/30/20 for the exam, diagnosis, procedures, and orders are all accurate and complete.  The entirety of the information documented in the History of Present Illness, Review of Systems and Physical Exam were personally obtained by me. Portions of this information were initially documented by Elonda Husky,  CMA and reviewed by me for thoroughness and accuracy.     Paulene Floor  [facility] [phone] (phone) [phone] (fax)  [place]

## 2020-07-19 ENCOUNTER — Other Ambulatory Visit: Payer: Self-pay

## 2020-07-19 ENCOUNTER — Other Ambulatory Visit: Payer: BC Managed Care – PPO

## 2020-07-19 DIAGNOSIS — Z20822 Contact with and (suspected) exposure to covid-19: Secondary | ICD-10-CM | POA: Diagnosis not present

## 2020-07-20 LAB — SARS-COV-2, NAA 2 DAY TAT

## 2020-07-20 LAB — NOVEL CORONAVIRUS, NAA: SARS-CoV-2, NAA: NOT DETECTED

## 2020-10-24 ENCOUNTER — Encounter: Payer: Self-pay | Admitting: Physician Assistant

## 2020-10-24 ENCOUNTER — Ambulatory Visit (INDEPENDENT_AMBULATORY_CARE_PROVIDER_SITE_OTHER): Payer: BC Managed Care – PPO | Admitting: Physician Assistant

## 2020-10-24 ENCOUNTER — Other Ambulatory Visit: Payer: Self-pay

## 2020-10-24 VITALS — BP 125/83 | HR 74 | Temp 98.2°F | Wt 213.0 lb

## 2020-10-24 DIAGNOSIS — M67431 Ganglion, right wrist: Secondary | ICD-10-CM | POA: Diagnosis not present

## 2020-10-24 MED ORDER — MELOXICAM 15 MG PO TABS: 15.0000 mg | ORAL_TABLET | Freq: Every day | ORAL | 0 refills | Status: DC

## 2020-10-24 NOTE — Progress Notes (Signed)
Established patient visit   Patient: Trevor Boyd   DOB: [birth year]   60 y.o. Male  MRN: [id number] Visit Date: 10/24/2020  Today's healthcare provider: Mar Daring, PA-C   Chief Complaint  Patient presents with  . Joint Swelling   Subjective    Wrist Pain  The pain is present in the left wrist. This is a new problem. The current episode started in the past 7 days.    Has had multiple injuries and fractures to the hand and forearm of the right. Has been doing a lot of lifting and pushing and pulling at work. Now with a swollen area on the dorsal aspect of the right wrist at the [facility] joint  Patient Active Problem List   Diagnosis Date Noted  . Encounter for screening colonoscopy   . Polyp of colon   . DVT (deep venous thrombosis) ([place]) 08/08/2019  . Bursitis of right knee 03/07/2019  . Arthritis of knee, degenerative 10/08/2015  . Situational stress 10/08/2015   Past Medical History:  Diagnosis Date  . Depression   . DVT (deep venous thrombosis) ([place]) 08/08/2019   Right calf, behind knee, and right thigh  . Osteoarthritis of right knee    Social History   Tobacco Use  . Smoking status: Never Smoker  . Smokeless tobacco: Never Used  Substance Use Topics  . Alcohol use: No    Alcohol/week: 0.0 standard drinks  . Drug use: No   No Known Allergies   Medications: Outpatient Medications Prior to Visit  Medication Sig  . sertraline (ZOLOFT) 50 MG tablet Take 1 tablet (50 mg total) by mouth daily. (Patient taking differently: Take 100 mg by mouth daily.)  . Turmeric (QC TUMERIC COMPLEX PO) Take by mouth. (Patient not taking: Reported on 04/11/2020)   No facility-administered medications prior to visit.    Review of Systems  Constitutional: Negative.   Respiratory: Negative.   Cardiovascular: Negative.   Musculoskeletal: Positive for arthralgias and joint swelling. Negative for back pain, gait problem, myalgias, neck pain and neck stiffness.         Objective    BP 125/83 (BP Location: Right Arm, Patient Position: Sitting, Cuff Size: Large)   Pulse 74   Temp 98.2 F (36.8 C) (Oral)   Wt 213 lb (96.6 kg)   BMI 28.10 kg/m    Physical Exam Vitals reviewed.  Constitutional:      General: He is not in acute distress.    Appearance: Normal appearance. He is well-developed and well-nourished. He is not ill-appearing.  HENT:     Head: Normocephalic and atraumatic.  Eyes:     Extraocular Movements: EOM normal.     Conjunctiva/sclera: Conjunctivae normal.  Pulmonary:     Effort: Pulmonary effort is normal. No respiratory distress.  Musculoskeletal:     Left wrist: Swelling and tenderness present. No bony tenderness, snuff box tenderness or crepitus. Decreased range of motion. Normal pulse.       Arms:     Cervical back: Normal range of motion and neck supple.  Neurological:     Mental Status: He is alert.  Psychiatric:        Mood and Affect: Mood and affect normal.        Behavior: Behavior normal.        Thought Content: Thought content normal.        Judgment: Judgment normal.      No results found for any visits on 10/24/20.  Assessment & Plan     1. Ganglion cyst of dorsum of right wrist Noted on right wrist. Cyst was drained today, see procedure note below. Tolerated well. Meloxicam for inflammation prn. Ice today and after work. Bracing can help. Call if returns or worsening. Will refer to Dr. Peggye Ley, [place], if worsening.  - meloxicam (MOBIC) 15 MG tablet; Take 1 tablet (15 mg total) by mouth daily.  Dispense: 30 tablet; Refill: 0  Procedure Note: Procedure, benefits and risks (including bleeding, infection and allergic reaction) and alternatives were explained to the patient. All questions were sought and answered. Verbal consent was obtained.  The area was then prepped with betadine swabs and draped in a sterile fashion. Local anesthetic was administered with cryospray. An 18G needle with a 20cc syringe  attached was used to drain the cyst. The area was then drained until no more fluid could be removed. The area was then cleaned and covered with a dry dressing. There were no complications and patient tolerated well. Wound cleansing instructions and signs of infection were verbally given to the patient.   No follow-ups on file.      Reynolds Bowl, PA-C, have reviewed all documentation for this visit. The documentation on 10/24/20 for the exam, diagnosis, procedures, and orders are all accurate and complete.   Rubye Beach  [facility] [phone] (phone) [phone] (fax)  [place]

## 2020-10-24 NOTE — Patient Instructions (Signed)
https://www.foothealthfacts.org/conditions/ganglion-cyst"> https://www.clinicalkey.com">  Ganglion Cyst  A ganglion cyst is a non-cancerous, fluid-filled lump of tissue that occurs near a joint, tendon, or ligament. The cyst grows out of a joint or the lining of a tendon or ligament. Ganglion cysts most often develop in the hand or wrist, but they can also develop in the shoulder, elbow, hip, knee, ankle, or foot. Ganglion cysts are ball-shaped or egg-shaped. Their size can range from the size of a pea to larger than a grape. Increased activity may cause the cyst to get bigger because more fluid starts to build up. What are the causes? The exact cause of this condition is not known, but it may be related to:  Inflammation or irritation around the joint.  An injury or tear in the layers of tissue around the joint (joint capsule).  Repetitive movements or overuse.  History of acute or repeated injury. What increases the risk? You are more likely to develop this condition if:  You are a male.  You are 20-40 years old. What are the signs or symptoms? The main symptom of this condition is a lump. It most often appears on the hand or wrist. In many cases, there are no other symptoms, but a cyst can sometimes cause:  Tingling.  Pain or tenderness.  Numbness.  Weakness or loss of strength in the affected joint.  Decreased range of motion in the affected area of the body.   How is this diagnosed? Ganglion cysts are usually diagnosed based on a physical exam. Your health care provider will feel the lump and may shine a light next to it. If it is a ganglion cyst, the light will likely shine through it. Your health care provider may order an X-ray, ultrasound, MRI, or CT scan to rule out other conditions. How is this treated? Ganglion cysts often go away on their own without treatment. If you have pain or other symptoms, treatment may be needed. Treatment is also needed if the ganglion  cyst limits your movement or if it gets infected. Treatment may include:  Wearing a brace or splint on your wrist or finger.  Taking anti-inflammatory medicine.  Having fluid drained from the lump with a needle (aspiration).  Getting an injection of medicine into the joint to decrease inflammation. This may be corticosteroids, ethanol, or hyaluronidase.  Having surgery to remove the ganglion cyst.  Placing a pad in your shoe or wearing shoes that will not rub against the cyst if it is on your foot. Follow these instructions at home:  Do not press on the ganglion cyst, poke it with a needle, or hit it.  Take over-the-counter and prescription medicines only as told by your health care provider.  If you have a brace or splint: ? Wear it as told by your health care provider. ? Remove it as told by your health care provider. Ask if you need to remove it when you take a shower or a bath.  Watch your ganglion cyst for any changes.  Keep all follow-up visits as told by your health care provider. This is important. Contact a health care provider if:  Your ganglion cyst becomes larger or more painful.  You have pus coming from the lump.  You have weakness or numbness in the affected area.  You have a fever or chills. Get help right away if:  You have a fever and have any of these in the cyst area: ? Increased redness. ? Red streaks. ? Swelling. Summary  A   ganglion cyst is a non-cancerous, fluid-filled lump that occurs near a joint, tendon, or ligament.  Ganglion cysts most often develop in the hand or wrist, but they can also develop in the shoulder, elbow, hip, knee, ankle, or foot.  Ganglion cysts often go away on their own without treatment. This information is not intended to replace advice given to you by your health care provider. Make sure you discuss any questions you have with your health care provider. Document Revised: 11/14/2019 Document Reviewed:  11/14/2019 Elsevier Patient Education  2021 Elsevier Inc.  

## 2020-11-10 ENCOUNTER — Encounter: Payer: Self-pay | Admitting: Physician Assistant

## 2020-11-10 DIAGNOSIS — M6740 Ganglion, unspecified site: Secondary | ICD-10-CM

## 2020-11-26 DIAGNOSIS — M19032 Primary osteoarthritis, left wrist: Secondary | ICD-10-CM | POA: Diagnosis not present

## 2021-01-22 ENCOUNTER — Other Ambulatory Visit: Payer: Self-pay | Admitting: Physician Assistant

## 2021-01-22 DIAGNOSIS — F439 Reaction to severe stress, unspecified: Secondary | ICD-10-CM

## 2021-01-22 MED ORDER — SERTRALINE HCL 50 MG PO TABS: 50.0000 mg | ORAL_TABLET | Freq: Every day | ORAL | 0 refills | Status: DC

## 2021-01-22 NOTE — Telephone Encounter (Signed)
Medication Refill - Medication: sertraline (ZOLOFT) 50 MG tablet     Preferred Pharmacy (with phone number or street name): [id number] [address], [place] Phone:  [phone]  Fax:  [phone]       Agent: Please be advised that RX refills may take up to 3 business days. We ask that you follow-up with your pharmacy.

## 2021-01-22 NOTE — Telephone Encounter (Signed)
Future visit in 4 months 

## 2021-01-30 ENCOUNTER — Other Ambulatory Visit: Payer: Self-pay

## 2021-01-30 DIAGNOSIS — F439 Reaction to severe stress, unspecified: Secondary | ICD-10-CM

## 2021-01-30 MED ORDER — SERTRALINE HCL 50 MG PO TABS: 50.0000 mg | ORAL_TABLET | Freq: Every day | ORAL | 0 refills | Status: DC

## 2021-01-30 NOTE — Telephone Encounter (Signed)
Patient wants 10 day supply sent to [facility] in [place]. Prescription sent in.

## 2021-01-30 NOTE — Telephone Encounter (Signed)
Please review. Ok to send in? Patient has an appt w/you on 02/23/21. Thanks!

## 2021-01-30 NOTE — Telephone Encounter (Signed)
Please clarify does he want 10 day supply sent to [facility] or a different pharmacy since [facility] doesn't have it.

## 2021-01-30 NOTE — Telephone Encounter (Signed)
Copied from [place] [id number]. Topic: General - Other >> Jan 30, 2021 12:29 PM Yvette Rack wrote: Reason for CRM: Pt stated his pharmacy told him that the Rx for sertraline (ZOLOFT) will not be available until 02/10/21. Pt requests that Dr. Caryn Section give Rx for 10 day supply for sertraline (ZOLOFT) 100 MG tablet.

## 2021-02-03 ENCOUNTER — Ambulatory Visit: Payer: Self-pay | Admitting: *Deleted

## 2021-02-03 DIAGNOSIS — M6740 Ganglion, unspecified site: Secondary | ICD-10-CM | POA: Diagnosis not present

## 2021-02-03 DIAGNOSIS — M6748 Ganglion, other site: Secondary | ICD-10-CM | POA: Diagnosis not present

## 2021-02-03 NOTE — Telephone Encounter (Signed)
Per agent: "Patient called and stated that he is having top left foot pain and would like some advise. No appointments at office available today."  Pt reports 8/10 pain "Top of left foot, where shoelaces would be tied." States swelling,"3 inches around" red, warm to touch. Does not recall any injury. Does not look like insect bite or boil. States taking IBU for pain, "Helps a little not much." States "Difficult to stay on feet at work with pain."  Directed to UC, states will follow disposition.   Reason for Disposition . [1] SEVERE pain (e.g., excruciating, unable to do any normal activities) AND [2] not improved after 2 hours of pain medicine  Answer Assessment - Initial Assessment Questions 1. ONSET: "When did the pain start?"      Yesterday 2. LOCATION: "Where is the pain located?"      Left foot, top "Where shoelaces are tied." 3. PAIN: "How bad is the pain?"    (Scale 1-10; or mild, moderate, severe)  - MILD (1-3): doesn't interfere with normal activities.   - MODERATE (4-7): interferes with normal activities (e.g., work or school) or awakens from sleep, limping.   - SEVERE (8-10): excruciating pain, unable to do any normal activities, unable to walk.      8/10 4. WORK OR EXERCISE: "Has there been any recent work or exercise that involved this part of the body?"      NO 5. CAUSE: "What do you think is causing the foot pain?"     Unsure 6. OTHER SYMPTOMS: "Do you have any other symptoms?" (e.g., leg pain, rash, fever, numbness)     "Raised up swelling, 3 in around." red, slightly warm."  Tender to touch  Protocols used: FOOT PAIN-A-AH

## 2021-02-06 DIAGNOSIS — L03116 Cellulitis of left lower limb: Secondary | ICD-10-CM | POA: Diagnosis not present

## 2021-02-16 ENCOUNTER — Other Ambulatory Visit: Payer: Self-pay | Admitting: Family Medicine

## 2021-02-16 DIAGNOSIS — F439 Reaction to severe stress, unspecified: Secondary | ICD-10-CM

## 2021-02-16 NOTE — Telephone Encounter (Signed)
Requested medications are due for refill today.  yes  Requested medications are on the active medications list.  yes  Last refill. 01/30/2021  Future visit scheduled.   yes  Notes to clinic.  Pt has been given a courtesy refill.

## 2021-02-16 NOTE — Telephone Encounter (Signed)
Copied from [place] [id number]. Topic: Quick Communication - Rx Refill/Question >> Feb 16, 2021  4:47 PM Mcneil, Ja-Kwan wrote: Medication: sertraline (ZOLOFT) 50 MG tablet  Has the patient contacted their pharmacy? Yes.   (Agent: If no, request that the patient contact the pharmacy for the refill.) (Agent: If yes, when and what did the pharmacy advise?)  Preferred Pharmacy (with phone number or street name): [facility] [place], [place] - [address] Phone: [phone]  Fax: [phone]  Agent: Please be advised that RX refills may take up to 3 business days. We ask that you follow-up with your pharmacy.

## 2021-02-17 MED ORDER — SERTRALINE HCL 50 MG PO TABS: 50.0000 mg | ORAL_TABLET | Freq: Every day | ORAL | 0 refills | Status: DC

## 2021-02-23 ENCOUNTER — Telehealth (INDEPENDENT_AMBULATORY_CARE_PROVIDER_SITE_OTHER): Payer: BC Managed Care – PPO | Admitting: Family Medicine

## 2021-02-23 ENCOUNTER — Encounter: Payer: Self-pay | Admitting: Family Medicine

## 2021-02-23 DIAGNOSIS — F439 Reaction to severe stress, unspecified: Secondary | ICD-10-CM

## 2021-02-23 MED ORDER — SERTRALINE HCL 100 MG PO TABS: 100.0000 mg | ORAL_TABLET | Freq: Every day | ORAL | 1 refills | Status: DC

## 2021-02-23 NOTE — Progress Notes (Signed)
MyChart Video Visit    Virtual Visit via Video Note   This visit type was conducted due to national recommendations for restrictions regarding the COVID-19 Pandemic (e.g. social distancing) in an effort to limit this patient's exposure and mitigate transmission in our community. This patient is at least at moderate risk for complications without adequate follow up. This format is felt to be most appropriate for this patient at this time. Physical exam was limited by quality of the video and audio technology used for the visit.   Patient location: home Provider location: [facility]  I discussed the limitations of evaluation and management by telemedicine and the availability of in person appointments. The patient expressed understanding and agreed to proceed.  Patient: Trevor Boyd   DOB: 09-10-1960   60 y.o. Male  MRN: [id number] Visit Date: 02/23/2021  Today's healthcare provider: Lelon Huh, MD   Chief Complaint  Patient presents with   Stress   Subjective    HPI  Situational stress, Follow-up  He was last seen for situational stress on 05/29/2020 (seen by Carles Collet, PA-C) Changes made at last visit include advising patient to reduce Zoloft by 25 mg every two weeks if he would like to discontinue completely, or until the dosage is acceptable to him.    He reports fair compliance with treatment. Patient states that he encountered more work related stress and increased the Zoloft to 100mg  daily. He recently decreased the dose back down to 50mg  daily because he was running low on the medication. Patient feels that the 100mg  daily dose is more effective.  He reports good tolerance of treatment. He is not having side effects.   He feels his anxiety and stress is moderate and Worse since last visit.  Symptoms: No chest pain No difficulty concentrating  No dizziness No fatigue  No feelings of losing control Yes insomnia  No irritable No palpitations  No panic attacks No  racing thoughts  No shortness of breath No sweating  No tremors/shakes    GAD-7 Results No flowsheet data found.  PHQ-9 Scores PHQ9 SCORE ONLY 02/23/2021 10/24/2020 05/29/2020  PHQ-9 Total Score 1 0 0    ---------------------------------------------------------------------------------------------------     Medications: Outpatient Medications Prior to Visit  Medication Sig   sertraline (ZOLOFT) 50 MG tablet Take 1 tablet (50 mg total) by mouth daily.   meloxicam (MOBIC) 15 MG tablet Take 1 tablet (15 mg total) by mouth daily. (Patient not taking: Reported on 02/23/2021)   No facility-administered medications prior to visit.    Review of Systems  Constitutional:  Negative for appetite change, chills and fever.  Respiratory:  Negative for chest tightness, shortness of breath and wheezing.   Cardiovascular:  Negative for chest pain and palpitations.  Gastrointestinal:  Negative for abdominal pain, nausea and vomiting.     Objective    There were no vitals taken for this visit.   Physical Exam   Awake, alert, oriented x 3. In no apparent distress   Assessment & Plan     1. Situational stress He tried reducing to 50mg  but has felt more stressed and irritable. He did start taking 2 a day of 50mg  recently, but is not running out. Will go back up to - sertraline (ZOLOFT) 100 MG tablet; Take 1 tablet (100 mg total) by mouth daily.  Dispense: 90 tablet; Refill: 1       I discussed the assessment and treatment plan with the patient. The patient was provided an opportunity  to ask questions and all were answered. The patient agreed with the plan and demonstrated an understanding of the instructions.   The patient was advised to call back or seek an in-person evaluation if the symptoms worsen or if the condition fails to improve as anticipated.  I provided 6 minutes of non-face-to-face time during this encounter.  The entirety of the information documented in the History of  Present Illness, Review of Systems and Physical Exam were personally obtained by me. Portions of this information were initially documented by the CMA and reviewed by me for thoroughness and accuracy.    Lelon Huh, MD [facility] [phone] (phone) [phone] (fax)  [place]

## 2021-03-31 ENCOUNTER — Encounter (INDEPENDENT_AMBULATORY_CARE_PROVIDER_SITE_OTHER): Payer: Self-pay

## 2021-04-01 DIAGNOSIS — M19032 Primary osteoarthritis, left wrist: Secondary | ICD-10-CM | POA: Diagnosis not present

## 2021-04-21 ENCOUNTER — Ambulatory Visit
Admission: EM | Admit: 2021-04-21 | Discharge: 2021-04-21 | Disposition: A | Discharge status: Home / Self Care | Payer: BC Managed Care – PPO | Attending: Internal Medicine | Admitting: Internal Medicine

## 2021-04-21 ENCOUNTER — Other Ambulatory Visit: Payer: Self-pay

## 2021-04-21 DIAGNOSIS — L03011 Cellulitis of right finger: Secondary | ICD-10-CM

## 2021-04-21 MED ORDER — DOXYCYCLINE HYCLATE 100 MG PO CAPS
100.0000 mg | ORAL_CAPSULE | Freq: Two times a day (BID) | ORAL | 0 refills | Status: DC
Start: 2021-04-21 — End: 2021-06-02

## 2021-04-21 NOTE — ED Triage Notes (Signed)
Pt reports having pain to R 4th finger since Friday. Sts after cutting grass he noticed swelling to finger. Last there was some drainage from finger.

## 2021-04-21 NOTE — ED Provider Notes (Signed)
[facility]    CSN: [id number] Arrival date & time: 04/21/21  1036      History   Chief Complaint Chief Complaint  Patient presents with   Finger Injury    HPI Trevor Boyd is a 60 y.o. male who presents with R 4th finger pain after cutting grass. He denies sting. Has noticed swelling and redness. He used a needle to drain the puss he noticed arcuating the nail bed area. Used warm compress and today is better. He admits of biting his nails.     Past Medical History:  Diagnosis Date   Depression    DVT (deep venous thrombosis) ([place]) 08/08/2019   Right calf, behind knee, and right thigh   Osteoarthritis of right knee     Patient Active Problem List   Diagnosis Date Noted   Encounter for screening colonoscopy    Polyp of colon    DVT (deep venous thrombosis) ([place]) 08/08/2019   Bursitis of right knee 03/07/2019   Arthritis of knee, degenerative 10/08/2015   Situational stress 10/08/2015    Past Surgical History:  Procedure Laterality Date   COLONOSCOPY  07/20/2013   Dr. Arther Dames, [facility]; Impression: one 54m polyp in the rectum. Lymphoid aggregate. Repeat 07/25/2018 Dr. RRayann Heman  COLONOSCOPY WITH PROPOFOL N/A 04/11/2020   Procedure: COLONOSCOPY WITH PROPOFOL;  Surgeon: TVirgel Manifold MD;  [facility];  Service: Gastroenterology;  Laterality: N/A;       Home Medications    Prior to Admission medications   Medication Sig Start Date End Date Taking? Authorizing Provider  doxycycline (VIBRAMYCIN) 100 MG capsule Take 1 capsule (100 mg total) by mouth 2 (two) times daily. 04/21/21  Yes Rodriguez-Southworth, SSunday Spillers PA-C  sertraline (ZOLOFT) 100 MG tablet Take 1 tablet (100 mg total) by mouth daily. 02/23/21   FBirdie Sons MD    Family History Family History  Problem Relation Age of Onset   Hypertension Mother    Hypertension Sister    Heart attack Maternal Grandmother    Heart attack Maternal Grandfather     Social History Social  History   Tobacco Use   Smoking status: Never   Smokeless tobacco: Never  Substance Use Topics   Alcohol use: No    Alcohol/week: 0.0 standard drinks   Drug use: No     Allergies   Patient has no known allergies.   Review of Systems Review of Systems + redness, drainage and pain of R ring finger. The rest is neg.   Physical Exam Triage Vital Signs ED Triage Vitals  Enc Vitals Group     BP 04/21/21 1138 135/82     Pulse Rate 04/21/21 1138 66     Resp 04/21/21 1138 16     Temp 04/21/21 1138 98.3 F (36.8 C)     Temp Source 04/21/21 1138 Oral     SpO2 04/21/21 1138 98 %     Weight 04/21/21 1139 210 lb (95.3 kg)     Height 04/21/21 1139 '6\' 1"'$  (1.854 m)     Head Circumference --      Peak Flow --      Pain Score 04/21/21 1139 5     Pain Loc --      Pain Edu? --      Excl. in [place] --    No data found.  Updated Vital Signs BP 135/82   Pulse 66   Temp 98.3 F (36.8 C) (Oral)   Resp 16  Ht '6\' 1"'$  (1.854 m)   Wt 210 lb (95.3 kg)   SpO2 98%   BMI 27.71 kg/m   Visual Acuity Right Eye Distance:   Left Eye Distance:   Bilateral Distance:    Right Eye Near:   Left Eye Near:    Bilateral Near:     Physical Exam Vitals reviewed.  HENT:     Right Ear: External ear normal.     Left Ear: External ear normal.  Eyes:     General: No scleral icterus.    Conjunctiva/sclera: Conjunctivae normal.  Pulmonary:     Effort: Pulmonary effort is normal.  Musculoskeletal:        General: Normal range of motion.     Cervical back: Neck supple.  Skin:    General: Skin is warm and dry.     Findings: No rash.     Comments: R 4th finger- redness and warmth under nail bed and surrounding tissue. No streaking or active oozing present.   Neurological:     Mental Status: He is alert and oriented to person, place, and time.     Gait: Gait normal.  Psychiatric:        Mood and Affect: Mood normal.        Thought Content: Thought content normal.        Judgment: Judgment  normal.     UC Treatments / Results  Labs (all labs ordered are listed, but only abnormal results are displayed) Labs Reviewed - No data to display  EKG   Radiology No results found.  Procedures Procedures (including critical care time)  Medications Ordered in UC Medications - No data to display  Initial Impression / Assessment and Plan / UC Course  I have reviewed the triage vital signs and the nursing notes. Has finger infection and I placed him on Doxy as noted.      Final Clinical Impressions(s) / UC Diagnoses   Final diagnoses:  Paronychia of finger of right hand     Discharge Instructions      Soak finger in Epson salts for 20 minutes twice a day for 2-3 days     ED Prescriptions     Medication Sig Dispense Auth. Provider   doxycycline (VIBRAMYCIN) 100 MG capsule Take 1 capsule (100 mg total) by mouth 2 (two) times daily. 20 capsule Rodriguez-Southworth, Sunday Spillers, PA-C      PDMP not reviewed this encounter.   Shelby Mattocks, PA-C 04/21/21 1206

## 2021-04-21 NOTE — Discharge Instructions (Addendum)
Soak finger in Epson salts for 20 minutes twice a day for 2-3 days

## 2021-06-02 ENCOUNTER — Encounter: Payer: Self-pay | Admitting: Family Medicine

## 2021-06-02 ENCOUNTER — Ambulatory Visit (INDEPENDENT_AMBULATORY_CARE_PROVIDER_SITE_OTHER): Payer: BC Managed Care – PPO | Admitting: Family Medicine

## 2021-06-02 ENCOUNTER — Encounter: Payer: Self-pay | Admitting: Physician Assistant

## 2021-06-02 ENCOUNTER — Other Ambulatory Visit: Payer: Self-pay

## 2021-06-02 VITALS — BP 127/86 | HR 71 | Temp 98.3°F | Resp 18 | Ht 73.0 in | Wt 213.0 lb

## 2021-06-02 DIAGNOSIS — F439 Reaction to severe stress, unspecified: Secondary | ICD-10-CM | POA: Diagnosis not present

## 2021-06-02 DIAGNOSIS — Z0000 Encounter for general adult medical examination without abnormal findings: Secondary | ICD-10-CM | POA: Insufficient documentation

## 2021-06-02 MED ORDER — SERTRALINE HCL 100 MG PO TABS: 100.0000 mg | ORAL_TABLET | Freq: Every day | ORAL | 3 refills | Status: DC

## 2021-06-02 NOTE — Assessment & Plan Note (Signed)
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  - Eat a low-fat diet with lots of fruits and vegetables, up to 7-9 servings per day.  - Seatbelts can save your life. Wear them always.  - Smoke detectors on every level of your home, check batteries every year.  - Eye Doctor - have an eye exam every 1-2 years  - Safe sex - if you may be exposed to STDs, use a condom.  - Alcohol -  If you drink, do it moderately, less than 2 drinks per day.  - [place]. Choose someone to speak for you if you are not able.  - Depression is common in our stressful world.If you're feeling down or losing interest in things you normally enjoy, please come in for a visit.  - Violence - If anyone is threatening or hurting you, please call immediately.

## 2021-06-02 NOTE — Progress Notes (Signed)
Complete physical exam   Patient: Trevor Boyd   DOB: [birth year]   60 y.o. Male  MRN: [id number] Visit Date: 06/02/2021  Today's healthcare provider: Gwyneth Sprout, FNP   Chief Complaint  Patient presents with   Annual Exam   Subjective    Trevor Boyd is a 60 y.o. male who presents today for a complete physical exam.  He reports consuming a general diet. The patient has a physically strenuous job, but has no regular exercise apart from work.  He generally feels fairly well. He reports sleeping fairly well. He does not have additional problems to discuss today.  HPI  Pt due for dental cleaning; has one scheduled in October.  Due for eye exam; typically goes in March.  Past Medical History:  Diagnosis Date   Depression    DVT (deep venous thrombosis) ([place]) 08/08/2019   Right calf, behind knee, and right thigh   Osteoarthritis of right knee    Past Surgical History:  Procedure Laterality Date   COLONOSCOPY  07/20/2013   Dr. Arther Dames, [facility]; Impression: one 46mm polyp in the rectum. Lymphoid aggregate. Repeat 07/25/2018 Dr. Rayann Heman   COLONOSCOPY WITH PROPOFOL N/A 04/11/2020   Procedure: COLONOSCOPY WITH PROPOFOL;  Surgeon: Virgel Manifold, MD;  Location: [facility] ENDOSCOPY;  Service: Gastroenterology;  Laterality: N/A;   Social History   Socioeconomic History   Marital status: Married    Spouse name: Not on file   Number of children: Not on file   Years of education: Not on file   Highest education level: Not on file  Occupational History   Not on file  Tobacco Use   Smoking status: Never   Smokeless tobacco: Never  Substance and Sexual Activity   Alcohol use: No    Alcohol/week: 0.0 standard drinks   Drug use: No   Sexual activity: Yes    Partners: Female  Other Topics Concern   Not on file  Social History Narrative   Not on file   Social Determinants of Health   Financial Resource Strain: Not on file  Food Insecurity: Not on file  Transportation Needs:  Not on file  Physical Activity: Not on file  Stress: Not on file  Social Connections: Not on file  Intimate Partner Violence: Not on file   Family Status  Relation Name Status   Mother  Alive   Father  Alive   Sister  Alive   MGM  Deceased       died from MI   MGF  Deceased       died from MI   [place]  Deceased   PGF  Deceased       died from old age   Family History  Problem Relation Age of Onset   Hypertension Mother    Hypertension Sister    Heart attack Maternal Grandmother    Heart attack Maternal Grandfather    No Known Allergies  Patient Care Team: Gwyneth Sprout, FNP as PCP - General (Family Medicine) Earlie Server, MD as Consulting Physician (Hematology and Oncology)   Medications: Outpatient Medications Prior to Visit  Medication Sig   [DISCONTINUED] sertraline (ZOLOFT) 100 MG tablet Take 1 tablet (100 mg total) by mouth daily.   [DISCONTINUED] doxycycline (VIBRAMYCIN) 100 MG capsule Take 1 capsule (100 mg total) by mouth 2 (two) times daily.   No facility-administered medications prior to visit.    Review of Systems     Objective  BP 127/86   Pulse 71   Temp 98.3 F (36.8 C) (Oral)   Resp 18   Ht 6\' 1"  (1.854 m)   Wt 213 lb (96.6 kg)   BMI 28.10 kg/m     Physical Exam Vitals and nursing note reviewed.  Constitutional:      General: He is awake. He is not in acute distress.    Appearance: Normal appearance. He is well-developed, well-groomed and overweight. He is not ill-appearing, toxic-appearing or diaphoretic.  HENT:     Head: Normocephalic and atraumatic.     Jaw: There is normal jaw occlusion. No trismus, tenderness, swelling or pain on movement.     Salivary Glands: Right salivary gland is not diffusely enlarged or tender. Left salivary gland is not diffusely enlarged or tender.     Right Ear: Hearing, tympanic membrane, ear canal and external ear normal. There is no impacted cerumen.     Left Ear: Hearing, tympanic membrane, ear canal and  external ear normal. There is no impacted cerumen.     Nose: Nose normal. No congestion or rhinorrhea.     Right Turbinates: Not enlarged, swollen or pale.     Left Turbinates: Not enlarged, swollen or pale.     Right Sinus: No maxillary sinus tenderness or frontal sinus tenderness.     Left Sinus: No maxillary sinus tenderness or frontal sinus tenderness.     Mouth/Throat:     Lips: Pink.     Mouth: Mucous membranes are moist. No injury, lacerations, oral lesions or angioedema.     Pharynx: Oropharynx is clear. Uvula midline. No pharyngeal swelling, oropharyngeal exudate or posterior oropharyngeal erythema.     Tonsils: No tonsillar exudate or tonsillar abscesses.  Eyes:     General: Lids are normal. Vision grossly intact. Gaze aligned appropriately.        Right eye: No discharge.        Left eye: No discharge.     Extraocular Movements: Extraocular movements intact.     Conjunctiva/sclera: Conjunctivae normal.     Pupils: Pupils are equal, round, and reactive to light.  Neck:     Thyroid: No thyroid mass, thyromegaly or thyroid tenderness.     Vascular: No carotid bruit.     Trachea: Trachea normal. No tracheal tenderness.  Cardiovascular:     Rate and Rhythm: Normal rate and regular rhythm.     Pulses: Normal pulses.          Carotid pulses are 2+ on the right side and 2+ on the left side.      Radial pulses are 2+ on the right side and 2+ on the left side.       Femoral pulses are 2+ on the right side and 2+ on the left side.      Popliteal pulses are 2+ on the right side and 2+ on the left side.       Dorsalis pedis pulses are 2+ on the right side and 2+ on the left side.       Posterior tibial pulses are 2+ on the right side and 2+ on the left side.     Heart sounds: Normal heart sounds, S1 normal and S2 normal. No murmur heard.   No friction rub. No gallop.  Pulmonary:     Effort: Pulmonary effort is normal. No respiratory distress.     Breath sounds: Normal breath sounds  and air entry. No stridor. No wheezing, rhonchi or rales.  Chest:  Chest wall: No tenderness.  Abdominal:     General: Abdomen is flat. Bowel sounds are normal. There is no distension.     Palpations: Abdomen is soft. There is no mass.     Tenderness: There is no abdominal tenderness. There is no guarding or rebound.     Hernia: No hernia is present.  Genitourinary:    Comments: Exam deferred; denies complaints Musculoskeletal:        General: Swelling present. No tenderness, deformity or signs of injury. Normal range of motion.       Arms:     Cervical back: Normal range of motion and neck supple. No rigidity or tenderness.     Right lower leg: No edema.     Left lower leg: No edema.     Comments: Plan for hand sx in [place] on 10/7; will be out of work for 3 wks- will require PT/OT for synovial fluid removal and 4 point fusion; out patient procedure  Lymphadenopathy:     Cervical: No cervical adenopathy.     Right cervical: No superficial, deep or posterior cervical adenopathy.    Left cervical: No superficial, deep or posterior cervical adenopathy.  Skin:    General: Skin is warm and dry.     Capillary Refill: Capillary refill takes less than 2 seconds.     Coloration: Skin is not jaundiced or pale.     Findings: No bruising, erythema, lesion or rash.  Neurological:     General: No focal deficit present.     Mental Status: He is alert and oriented to person, place, and time. Mental status is at baseline.     GCS: GCS eye subscore is 4. GCS verbal subscore is 5. GCS motor subscore is 6.     Cranial Nerves: Cranial nerves are intact.     Sensory: Sensation is intact. No sensory deficit.     Motor: Motor function is intact. No weakness.     Coordination: Coordination is intact.     Gait: Gait is intact.  Psychiatric:        Attention and Perception: Attention and perception normal.        Mood and Affect: Mood and affect normal.        Speech: Speech normal.         Behavior: Behavior normal. Behavior is cooperative.        Thought Content: Thought content normal.        Cognition and Memory: Cognition normal.        Judgment: Judgment normal.     Last depression screening scores PHQ 2/9 Scores 06/02/2021 02/23/2021 10/24/2020  PHQ - 2 Score 0 0 0  PHQ- 9 Score 0 1 0   Last fall risk screening Fall Risk  06/02/2021  Falls in the past year? 0  Number falls in past yr: 0  Injury with Fall? 0  Risk for fall due to : No Fall Risks  Follow up Falls evaluation completed   Last Audit-C alcohol use screening Alcohol Use Disorder Test (AUDIT) 10/24/2020  1. How often do you have a drink containing alcohol? 0  2. How many drinks containing alcohol do you have on a typical day when you are drinking? 0  3. How often do you have six or more drinks on one occasion? 0  AUDIT-C Score 0   A score of 3 or more in women, and 4 or more in men indicates increased risk for alcohol abuse, EXCEPT if all of  the points are from question 1   No results found for any visits on 06/02/21.  Assessment & Plan    Routine Health Maintenance and Physical Exam  Exercise Activities and Dietary recommendations  Goals   None     Immunization History  Administered Date(s) Administered   Influenza,inj,Quad PF,6+ Mos 06/28/2019   Td 01/18/2019   Tdap 07/27/2005    Health Maintenance  Topic Date Due   COVID-19 Vaccine (1) Never done   Zoster Vaccines- Shingrix (1 of 2) Never done   COLONOSCOPY (Pts 45-52yrs Insurance coverage will need to be confirmed)  04/11/2021   INFLUENZA VACCINE  12/04/2021 (Originally 04/06/2021)   TETANUS/TDAP  01/17/2029   Hepatitis C Screening  Completed   HIV Screening  Completed   HPV VACCINES  Aged Out    Discussed health benefits of physical activity, and encouraged him to engage in regular exercise appropriate for his age and condition.  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  - Eat a low-fat diet with lots of fruits and vegetables, up to 7-9 servings per day.  - Seatbelts can save your life. Wear them always.  - Smoke detectors on every level of your home, check batteries every year.  - Eye Doctor - have an eye exam every 1-2 years  - Safe sex - if you may be exposed to STDs, use a condom.  - Alcohol -  If you drink, do it moderately, less than 2 drinks per day.  - [place]. Choose someone to speak for you if you are not able.  - Depression is common in our stressful world.If you're feeling down or losing interest in things you normally enjoy, please come in for a visit.  - Violence - If anyone is threatening or hurting you, please call immediately.        Relevant Orders   CBC with Differential/Platelet   Comprehensive metabolic panel   Hemoglobin A1c   TSH   PSA   Lipid panel     Return in about 1 year (around 06/02/2022) for annual examination.     Vonna Kotyk, FNP, have reviewed all documentation for this visit. The documentation on 06/02/21 for the exam, diagnosis, procedures, and orders are all accurate and complete.    Gwyneth Sprout, [place] [phone] (phone) [phone] (fax)  [place]

## 2021-06-02 NOTE — Assessment & Plan Note (Signed)
Doing well on daily SSRI; patient notices less stressors with 100mg  dose than 50mg  dose Happy to continue Refill sent PHQ9 reviewed

## 2021-06-03 LAB — COMPREHENSIVE METABOLIC PANEL
ALT: 20 IU/L (ref 0–44)
AST: 20 IU/L (ref 0–40)
Albumin/Globulin Ratio: 2 (ref 1.2–2.2)
Albumin: 4.4 g/dL (ref 3.8–4.9)
Alkaline Phosphatase: 147 IU/L — ABNORMAL HIGH (ref 44–121)
BUN/Creatinine Ratio: 17 (ref 9–20)
BUN: 18 mg/dL (ref 6–24)
Bilirubin Total: 0.5 mg/dL (ref 0.0–1.2)
CO2: 21 mmol/L (ref 20–29)
Calcium: 9.3 mg/dL (ref 8.7–10.2)
Chloride: 104 mmol/L (ref 96–106)
Creatinine, Ser: 1.09 mg/dL (ref 0.76–1.27)
Globulin, Total: 2.2 g/dL (ref 1.5–4.5)
Glucose: 74 mg/dL (ref 70–99)
Potassium: 4.4 mmol/L (ref 3.5–5.2)
Sodium: 141 mmol/L (ref 134–144)
Total Protein: 6.6 g/dL (ref 6.0–8.5)
eGFR: 78 mL/min/{1.73_m2} (ref >59)

## 2021-06-03 LAB — CBC WITH DIFFERENTIAL/PLATELET
Basophils Absolute: 0 10*3/uL (ref 0.0–0.2)
Basos: 1 %
EOS (ABSOLUTE): 0.1 10*3/uL (ref 0.0–0.4)
Eos: 2 %
Hematocrit: 48 % (ref 37.5–51.0)
Hemoglobin: 16.4 g/dL (ref 13.0–17.7)
Immature Grans (Abs): 0 10*3/uL (ref 0.0–0.1)
Immature Granulocytes: 0 %
Lymphocytes Absolute: 1.7 10*3/uL (ref 0.7–3.1)
Lymphs: 25 %
MCH: 30.7 pg (ref 26.6–33.0)
MCHC: 34.2 g/dL (ref 31.5–35.7)
MCV: 90 fL (ref 79–97)
Monocytes Absolute: 0.8 10*3/uL (ref 0.1–0.9)
Monocytes: 12 %
Neutrophils Absolute: 4 10*3/uL (ref 1.4–7.0)
Neutrophils: 60 %
Platelets: 280 10*3/uL (ref 150–450)
RBC: 5.35 x10E6/uL (ref 4.14–5.80)
RDW: 12.7 % (ref 11.6–15.4)
WBC: 6.6 10*3/uL (ref 3.4–10.8)

## 2021-06-03 LAB — LIPID PANEL
Chol/HDL Ratio: 3.7 ratio (ref 0.0–5.0)
Cholesterol, Total: 154 mg/dL (ref 100–199)
HDL: 42 mg/dL (ref >39)
LDL Chol Calc (NIH): 93 mg/dL (ref 0–99)
Triglycerides: 103 mg/dL (ref 0–149)
VLDL Cholesterol Cal: 19 mg/dL (ref 5–40)

## 2021-06-03 LAB — HEMOGLOBIN A1C
Est. average glucose Bld gHb Est-mCnc: 123 mg/dL
Hgb A1c MFr Bld: 5.9 % — ABNORMAL HIGH (ref 4.8–5.6)

## 2021-06-03 LAB — TSH: TSH: 1.53 u[IU]/mL (ref 0.450–4.500)

## 2021-06-03 LAB — PSA: Prostate Specific Ag, Serum: 0.7 ng/mL (ref 0.0–4.0)

## 2021-06-12 DIAGNOSIS — G8918 Other acute postprocedural pain: Secondary | ICD-10-CM | POA: Diagnosis not present

## 2021-06-12 DIAGNOSIS — M1812 Unilateral primary osteoarthritis of first carpometacarpal joint, left hand: Secondary | ICD-10-CM | POA: Diagnosis not present

## 2021-06-12 DIAGNOSIS — M19032 Primary osteoarthritis, left wrist: Secondary | ICD-10-CM | POA: Diagnosis not present

## 2021-06-12 DIAGNOSIS — M25532 Pain in left wrist: Secondary | ICD-10-CM | POA: Diagnosis not present

## 2021-06-24 DIAGNOSIS — Z4889 Encounter for other specified surgical aftercare: Secondary | ICD-10-CM | POA: Diagnosis not present

## 2021-06-24 DIAGNOSIS — M19032 Primary osteoarthritis, left wrist: Secondary | ICD-10-CM | POA: Diagnosis not present

## 2021-07-21 ENCOUNTER — Ambulatory Visit: Payer: Self-pay | Admitting: *Deleted

## 2021-07-21 ENCOUNTER — Other Ambulatory Visit: Payer: Self-pay

## 2021-07-21 ENCOUNTER — Emergency Department
Admission: EM | Admit: 2021-07-21 | Discharge: 2021-07-21 | Disposition: A | Discharge status: Home / Self Care | Payer: BC Managed Care – PPO | Attending: Emergency Medicine | Admitting: Emergency Medicine

## 2021-07-21 ENCOUNTER — Emergency Department: Payer: BC Managed Care – PPO

## 2021-07-21 DIAGNOSIS — M79662 Pain in left lower leg: Secondary | ICD-10-CM | POA: Diagnosis not present

## 2021-07-21 DIAGNOSIS — M79605 Pain in left leg: Secondary | ICD-10-CM | POA: Diagnosis not present

## 2021-07-21 NOTE — Telephone Encounter (Signed)
FYI

## 2021-07-21 NOTE — ED Triage Notes (Signed)
Pt sent from PCP for DVT r/o , pt c/o having cramping in the left thigh for the past 4 weeks, recent sx to the left wrist.,

## 2021-07-21 NOTE — ED Provider Notes (Signed)
[facility] Emergency Department Provider Note  ____________________________________________   Event Date/Time   First MD Initiated Contact with Patient 07/21/21 1053     (approximate)  I have reviewed the triage vital signs and the nursing notes.   HISTORY  Chief Complaint Leg Pain   HPI Trevor Boyd is a 60 y.o. male with a past medical history of depression and remote DVT not currently on anticoagulation as well as recent left wrist surgery who presents for assessment of 3 to 4 weeks of some intermittent soreness in his left calf.  Patient states he has no new pain in his left wrist after surgery which he feels has been healing well.  He has no new weakness or numbness in left hand or any in the left lower extremity.  He states he currently has no pain in his left leg and states it only happens sometimes.  He denies any redness pain or swelling or any other clear associated symptoms including fevers, chills, cough, nausea, vomiting, diarrhea, burning with urination, rash or any other acute sick symptoms.  No recent injuries or falls.  No other acute concerns at this time.         Past Medical History:  Diagnosis Date   Depression    DVT (deep venous thrombosis) ([place]) 08/08/2019   Right calf, behind knee, and right thigh   Osteoarthritis of right knee     Patient Active Problem List   Diagnosis Date Noted   Annual physical exam 06/02/2021   Encounter for screening colonoscopy    Polyp of colon    DVT (deep venous thrombosis) ([place]) 08/08/2019   Bursitis of right knee 03/07/2019   Arthritis of knee, degenerative 10/08/2015   Situational stress 10/08/2015    Past Surgical History:  Procedure Laterality Date   COLONOSCOPY  07/20/2013   Dr. Arther Dames, [facility]; Impression: one 61mm polyp in the rectum. Lymphoid aggregate. Repeat 07/25/2018 Dr. Rayann Heman   COLONOSCOPY WITH PROPOFOL N/A 04/11/2020   Procedure: COLONOSCOPY WITH PROPOFOL;  Surgeon: Virgel Manifold, MD;  Location: [facility] ENDOSCOPY;  Service: Gastroenterology;  Laterality: N/A;    Prior to Admission medications   Medication Sig Start Date End Date Taking? Authorizing Provider  sertraline (ZOLOFT) 100 MG tablet Take 1 tablet (100 mg total) by mouth daily. 06/02/21   Gwyneth Sprout, FNP    Allergies Patient has no known allergies.  Family History  Problem Relation Age of Onset   Hypertension Mother    Hypertension Sister    Heart attack Maternal Grandmother    Heart attack Maternal Grandfather     Social History Social History   Tobacco Use   Smoking status: Never   Smokeless tobacco: Never  Substance Use Topics   Alcohol use: No    Alcohol/week: 0.0 standard drinks   Drug use: No    Review of Systems  Review of Systems  Constitutional:  Negative for chills and fever.  HENT:  Negative for sore throat.   Eyes:  Negative for pain.  Respiratory:  Negative for cough and stridor.   Cardiovascular:  Negative for chest pain.  Gastrointestinal:  Negative for vomiting.  Genitourinary:  Negative for dysuria.  Musculoskeletal:  Positive for joint pain (improving in L wrist from surgery several weeks ago) and myalgias (L leg).  Skin:  Negative for rash.  Neurological:  Negative for seizures, loss of consciousness and headaches.  Psychiatric/Behavioral:  Negative for suicidal ideas.   All other systems reviewed and  are negative.    ____________________________________________   PHYSICAL EXAM:  VITAL SIGNS: ED Triage Vitals  Enc Vitals Group     BP 07/21/21 1035 (!) 142/88     Pulse Rate 07/21/21 1035 70     Resp 07/21/21 1035 16     Temp 07/21/21 1035 97.8 F (36.6 C)     Temp Source 07/21/21 1035 Oral     SpO2 07/21/21 1035 95 %     Weight 07/21/21 1049 212 lb 15.4 oz (96.6 kg)     Height 07/21/21 1049 6\' 1"  (1.854 m)     Head Circumference --      Peak Flow --      Pain Score --      Pain Loc --      Pain Edu? --      Excl. in [place]? --    Vitals:    07/21/21 1035  BP: (!) 142/88  Pulse: 70  Resp: 16  Temp: 97.8 F (36.6 C)  SpO2: 95%   Physical Exam Vitals and nursing note reviewed.  Constitutional:      Appearance: He is well-developed.  HENT:     Head: Normocephalic and atraumatic.     Right Ear: External ear normal.     Left Ear: External ear normal.     Nose: Nose normal.  Eyes:     Conjunctiva/sclera: Conjunctivae normal.  Cardiovascular:     Rate and Rhythm: Normal rate and regular rhythm.     Heart sounds: No murmur heard. Pulmonary:     Effort: Pulmonary effort is normal. No respiratory distress.     Breath sounds: Normal breath sounds.  Abdominal:     Palpations: Abdomen is soft.     Tenderness: There is no abdominal tenderness.  Musculoskeletal:     Cervical back: Neck supple.  Skin:    General: Skin is warm and dry.     Capillary Refill: Capillary refill takes less than 2 seconds.  Neurological:     Mental Status: He is alert and oriented to person, place, and time.  Psychiatric:        Mood and Affect: Mood normal.    Patient has full strength and range of motion throughout his bilateral lower extremities.  The left calf there is no induration, erythema, tenderness patient has a 2+ DP pulse.  He is able to flex and extend with full strength at the left knee and ankle move all toes with symmetric strength compared to the right.  Calf is otherwise unremarkable. ____________________________________________   LABS (all labs ordered are listed, but only abnormal results are displayed)  Labs Reviewed - No data to display ____________________________________________  EKG  ____________________________________________  RADIOLOGY  ED MD interpretation: Ultrasound of the left lower extremity shows no evidence of a DVT, Baker's cyst, abscess or other acute abnormality.  Official radiology report(s): US Venous Img Lower Unilateral Left  Result Date: 07/21/2021 CLINICAL DATA:  4 week history cramping  in the calf. EXAM: LEFT LOWER EXTREMITY VENOUS DOPPLER ULTRASOUND TECHNIQUE: Gray-scale sonography with compression, as well as color and duplex ultrasound, were performed to evaluate the deep venous system(s) from the level of the common femoral vein through the popliteal and proximal calf veins. COMPARISON:  None. FINDINGS: VENOUS Normal compressibility of the common femoral, superficial femoral, and popliteal veins, as well as the visualized calf veins. Visualized portions of profunda femoral vein and great saphenous vein unremarkable. No filling defects to suggest DVT on grayscale or color Doppler imaging.  Doppler waveforms show normal direction of venous flow, normal respiratory plasticity and response to augmentation. Limited views of the contralateral common femoral vein are unremarkable. OTHER None. Limitations: none IMPRESSION: Negative. Electronically Signed   By: Misty Stanley M.D.   On: 07/21/2021 11:23    ____________________________________________   PROCEDURES  Procedure(s) performed (including Critical Care):  Procedures   ____________________________________________   INITIAL IMPRESSION / ASSESSMENT AND PLAN / ED COURSE      Patient presents with above-stated history and exam for assessment of some intermittent soreness in his left calf over the last 3 to 4 weeks.  On arrival he is afebrile and hemodynamically stable.  On exam his calf is unremarkable without evidence of trauma or infection.  Compartments are soft and not consistent with compartment syndrome.  Ultrasound shows no evidence of DVT, abscess, cyst or other acute process.  Patient states he has no pain at this time and is able to ambulate with steady gait unassisted.  Differential considerations include possible muscle spasms versus other not immediately life-threatening process.  Given stable vitals with reassuring ultrasound low suspicion for infectious process I think patient is stable for discharge with further  outpatient evaluation.  Discharged in stable condition.  Strict return precautions advised and discussed.       ____________________________________________   FINAL CLINICAL IMPRESSION(S) / ED DIAGNOSES  Final diagnoses:  Pain of left calf    Medications - No data to display   ED Discharge Orders     None        Note:  This document was prepared using Dragon voice recognition software and may include unintentional dictation errors.    Lucrezia Starch, MD 07/21/21 1146

## 2021-07-21 NOTE — Telephone Encounter (Signed)
Reason for Disposition  [1] Thigh or calf pain AND [2] only 1 side AND [3] present > 1 hour    History of blood clots in right leg had 3 clots in 2021  Answer Assessment - Initial Assessment Questions 1. ONSET: "When did the swelling start?" (e.g., minutes, hours, days)     Pt calling in  Going on 2-3 weeks.   Sometimes right calf feels like a charlie horse that comes and goes. 2. LOCATION: "What part of the leg is swollen?"  "Are both legs swollen or just one leg?"     Left leg in calf area feels like a tightness under the muscle.   I had blood clots in 2021.   No warmth difference or appearance looks same. I had surgery on wrist Oct. 7th.   I've not been active. No numbness or tingling in left foot.   This feels same symptoms as blood clot in my right leg. 3. SEVERITY: "How bad is the swelling?" (e.g., localized; mild, moderate, severe)  - Localized - small area of swelling localized to one leg  - MILD pedal edema - swelling limited to foot and ankle, pitting edema < 1/4 inch (6 mm) deep, rest and elevation eliminate most or all swelling  - MODERATE edema - swelling of lower leg to knee, pitting edema > 1/4 inch (6 mm) deep, rest and elevation only partially reduce swelling  - SEVERE edema - swelling extends above knee, facial or hand swelling present      No pain just an area that feels like the blood clot.   Last night I felt under a lump in my calf area 4. REDNESS: "Does the swelling look red or infected?"     No 5. PAIN: "Is the swelling painful to touch?" If Yes, ask: "How painful is it?"   (Scale 1-10; mild, moderate or severe)     2/10 pain scale 6. FEVER: "Do you have a fever?" If Yes, ask: "What is it, how was it measured, and when did it start?"      No 7. CAUSE: "What do you think is causing the leg swelling?"     A blood clot 8. MEDICAL HISTORY: "Do you have a history of heart failure, kidney disease, liver failure, or cancer?"     Yes right calf blood clots in 2021 9.  RECURRENT SYMPTOM: "Have you had leg swelling before?" If Yes, ask: "When was the last time?" "What happened that time?"     I had blood clots in right leg in 2021 10. OTHER SYMPTOMS: "Do you have any other symptoms?" (e.g., chest pain, difficulty breathing)       No 11. PREGNANCY: "Is there any chance you are pregnant?" "When was your last menstrual period?"       N/A  Protocols used: Leg Swelling and Edema-A-AH

## 2021-07-21 NOTE — Telephone Encounter (Signed)
Pt called in c/o left discomfort/tightness in his left calf behind the muscle.   He had 3 blood clots in his right leg in 2021 and these symptoms feel the same. See triage notes.  I have referred him to the ED.  He is going to [facility].  I sent my notes to [facility]

## 2021-07-21 NOTE — ED Notes (Signed)
See triage note.  Presents with pain to left thigh area  describes as cramping  sent in by PCP to r/o DVT

## 2021-07-22 DIAGNOSIS — Z4889 Encounter for other specified surgical aftercare: Secondary | ICD-10-CM | POA: Diagnosis not present

## 2021-07-22 DIAGNOSIS — M19032 Primary osteoarthritis, left wrist: Secondary | ICD-10-CM | POA: Diagnosis not present

## 2021-08-05 IMAGING — US US EXTREM LOW VENOUS*R*
1 series · 13 of 24 positions shown · non-contrast
Comparison: 07/02/2019

CLINICAL DATA: HISTORY OF DVT.  PAIN.  ANTICOAGULATION



[Series 1: us extrem low venous*right* · 0.08mm/px · 13 of 34 slices shown]
[im 1/34]
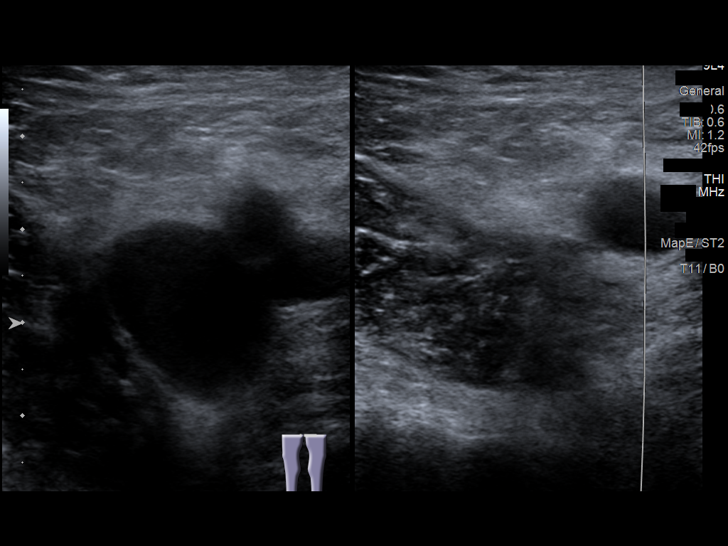
[im 3/34]
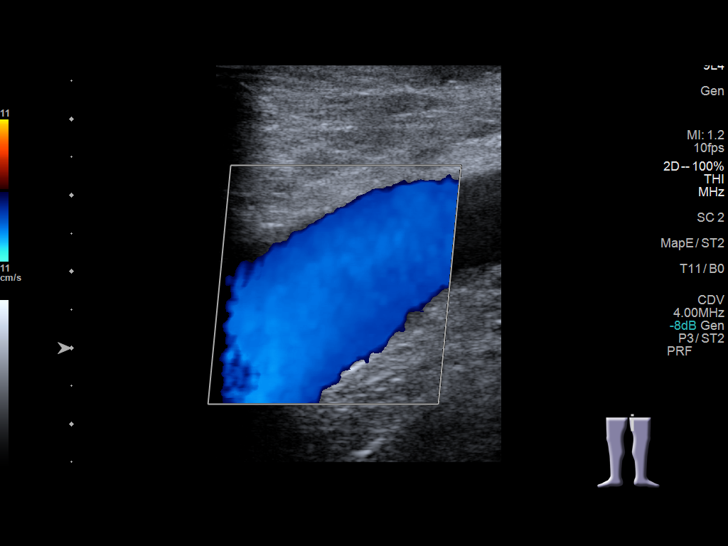
[im 6/34]
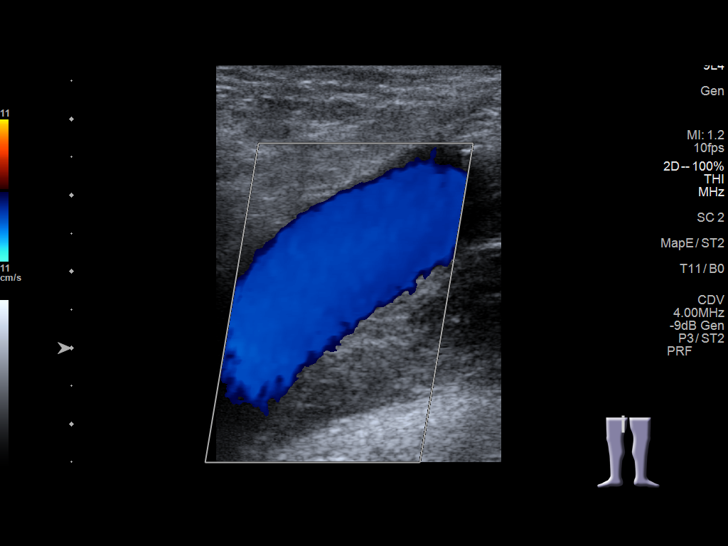
[im 9/34]
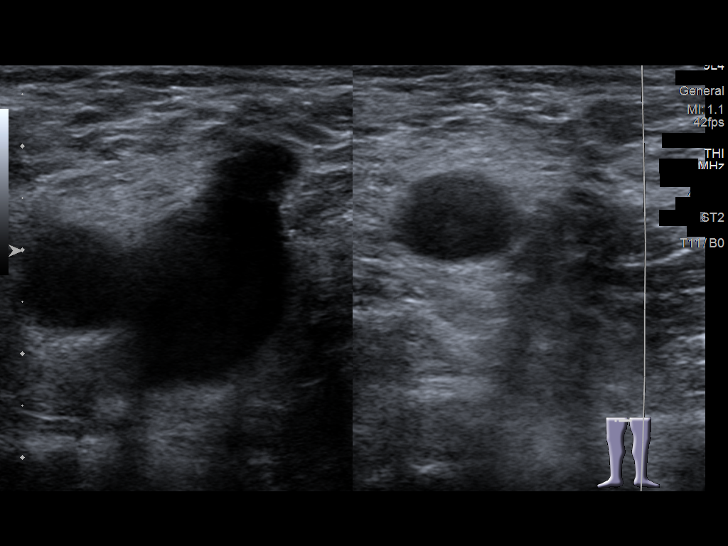
[im 12/34]
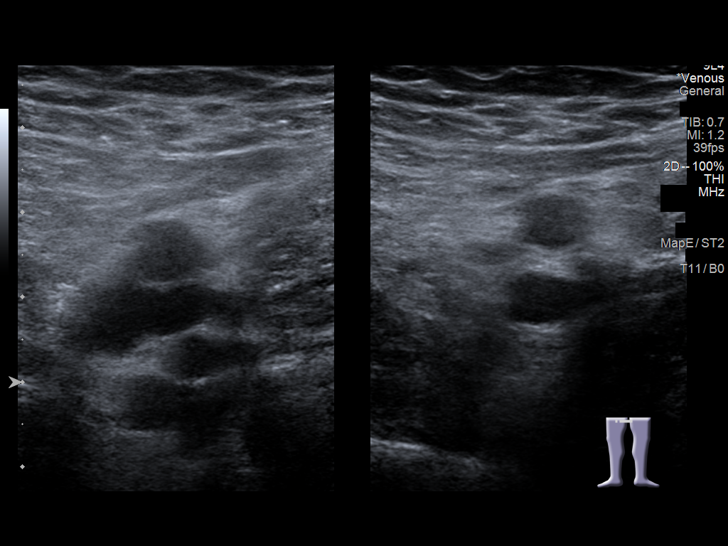
[im 15/34]
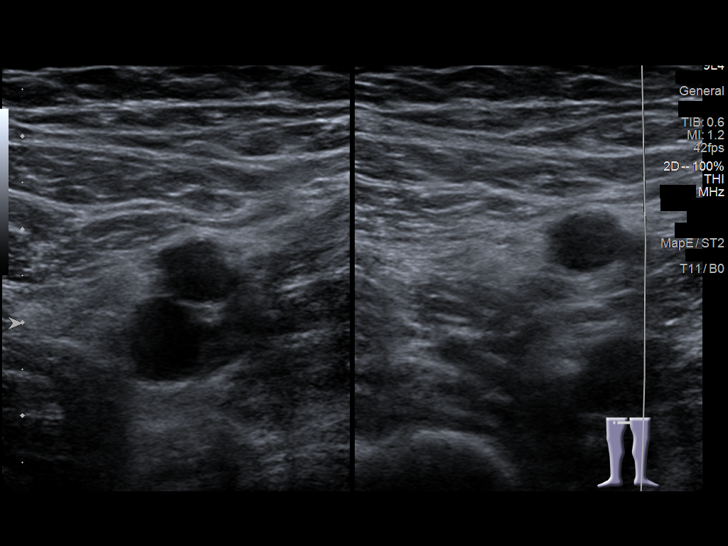
[im 18/34]
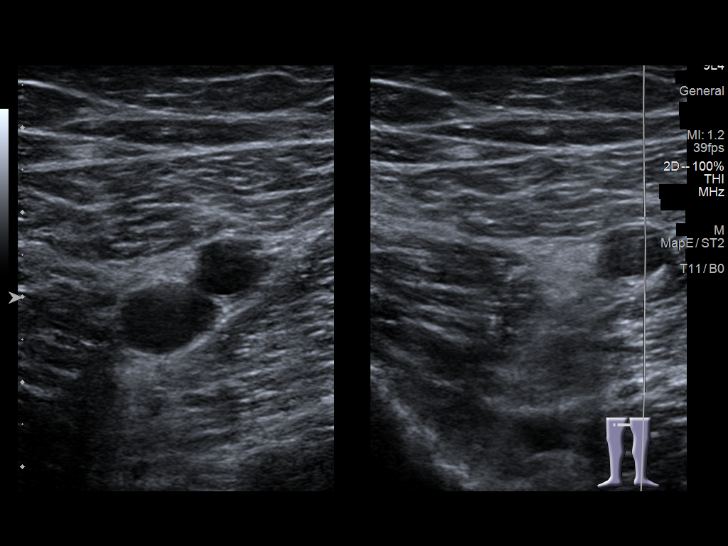
[im 19/34]
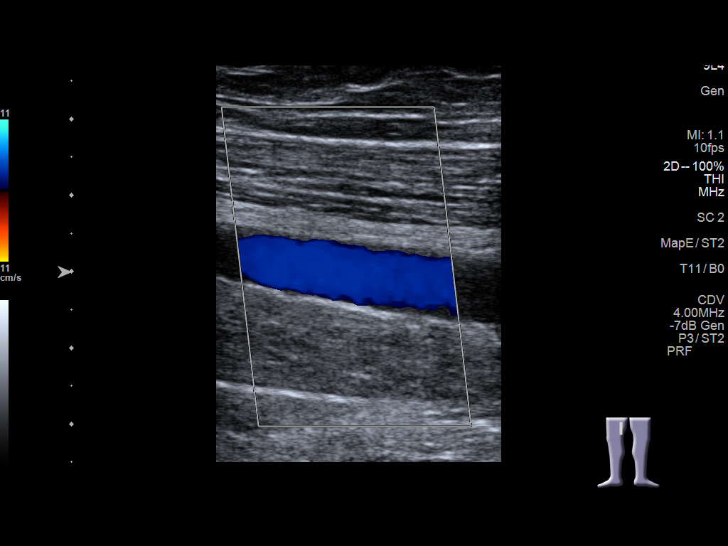
[im 22/34]
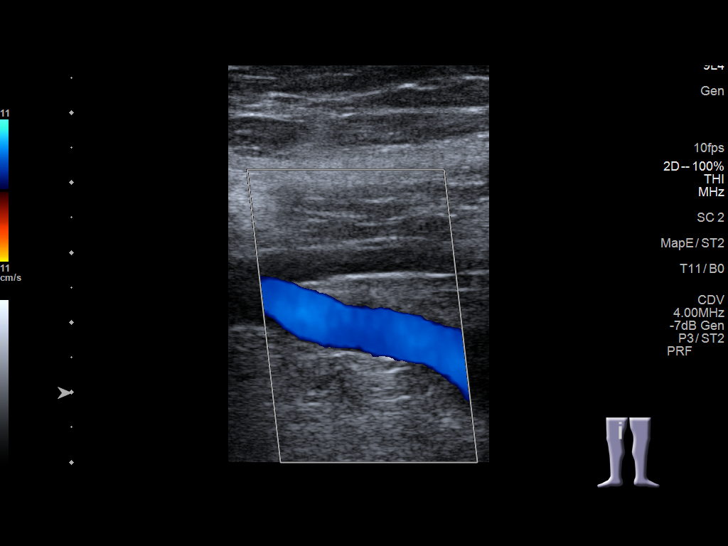
[im 25/34]
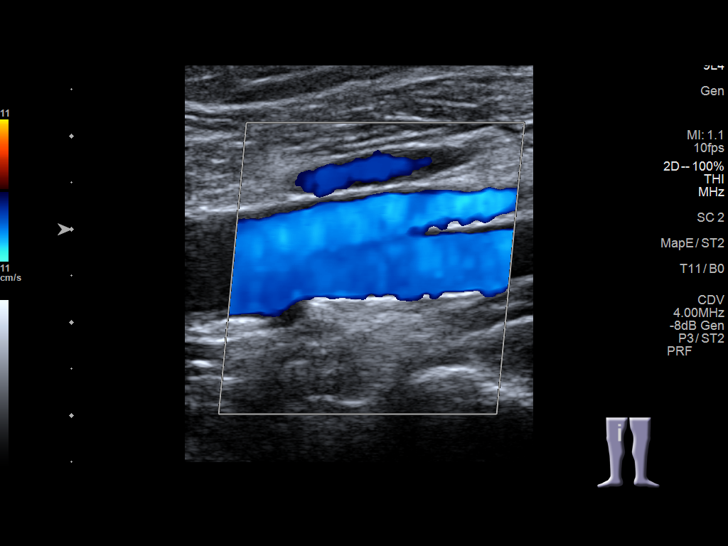
[im 28/34]
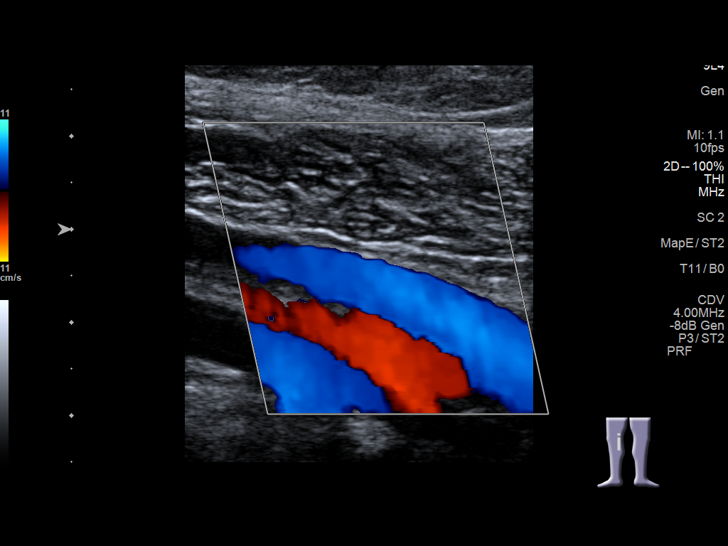
[im 31/34]
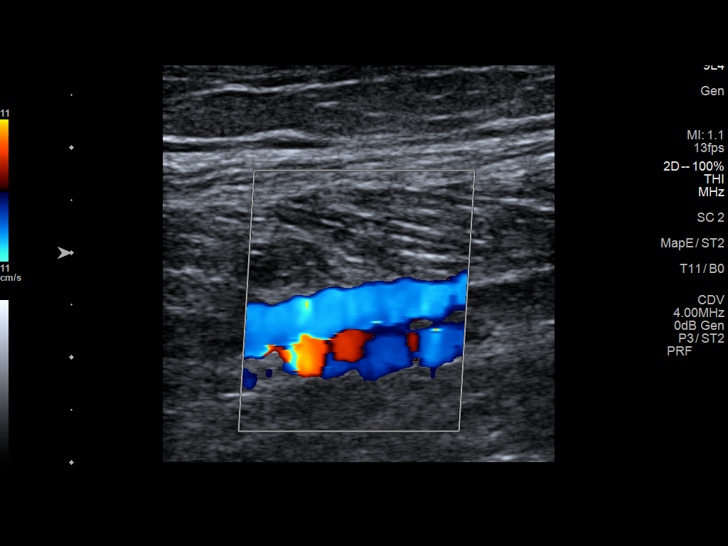
[im 34/34]
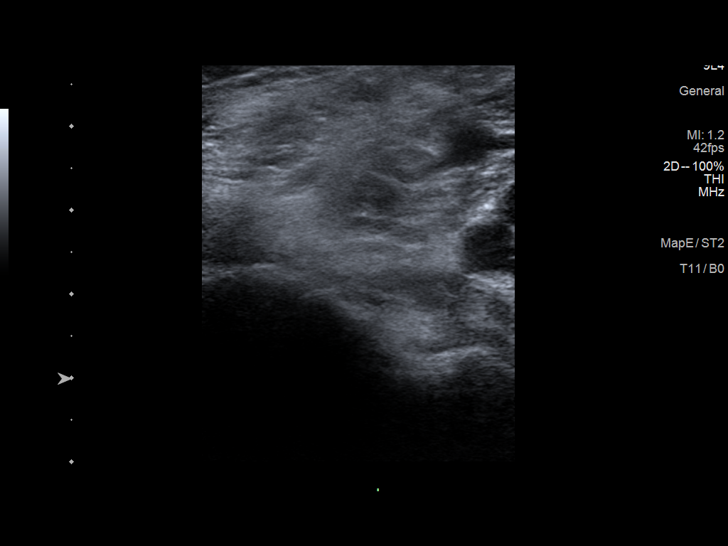

[13 of 24 positions shown; findings below may reference images not displayed]

FINDINGS: Contralateral Common Femoral Vein: Respiratory phasicity is normal
and symmetric with the symptomatic side. No evidence of thrombus.
Normal compressibility.

Common Femoral Vein: No evidence of thrombus. Normal
compressibility, respiratory phasicity and response to augmentation.

Saphenofemoral Junction: No evidence of thrombus. Normal
compressibility and flow on color Doppler imaging.

Profunda Femoral Vein: No evidence of thrombus. Normal
compressibility and flow on color Doppler imaging.

Femoral Vein: No evidence of thrombus. Normal compressibility,
respiratory phasicity and response to augmentation.

Popliteal Vein: No evidence of thrombus. Normal compressibility,
respiratory phasicity and response to augmentation. previous
popliteal dvt has resolved.

Calf Veins: No evidence of thrombus. Normal compressibility and flow
on color Doppler imaging.
IMPRESSION: Previous right popliteal DVT has resolved. No current significant
right lower extremity DVT.

## 2021-08-26 DIAGNOSIS — M19032 Primary osteoarthritis, left wrist: Secondary | ICD-10-CM | POA: Diagnosis not present

## 2021-08-26 DIAGNOSIS — Z4889 Encounter for other specified surgical aftercare: Secondary | ICD-10-CM | POA: Diagnosis not present

## 2022-01-29 ENCOUNTER — Ambulatory Visit (INDEPENDENT_AMBULATORY_CARE_PROVIDER_SITE_OTHER): Payer: BC Managed Care – PPO | Admitting: Family Medicine

## 2022-01-29 ENCOUNTER — Encounter: Payer: Self-pay | Admitting: Family Medicine

## 2022-01-29 VITALS — BP 125/88 | HR 81 | Resp 16 | Wt 215.0 lb

## 2022-01-29 DIAGNOSIS — M25561 Pain in right knee: Secondary | ICD-10-CM | POA: Insufficient documentation

## 2022-01-29 DIAGNOSIS — M25569 Pain in unspecified knee: Secondary | ICD-10-CM | POA: Insufficient documentation

## 2022-01-29 MED ORDER — MELOXICAM 15 MG PO TABS: 15.0000 mg | ORAL_TABLET | Freq: Every day | ORAL | 1 refills | Status: DC

## 2022-01-29 MED ORDER — CYCLOBENZAPRINE HCL 10 MG PO TABS: 10.0000 mg | ORAL_TABLET | Freq: Every day | ORAL | 0 refills | Status: DC

## 2022-01-29 NOTE — Assessment & Plan Note (Signed)
Acute x 3 weeks Initially R knee gave out when patient was walking up stairs, patient caught himself, landing on hands, and did not fall on knees However, knee pain has continued since- with morning stiffness and difficulty rising from ground to truck which is lifted Hx of DVT- which had previous symptoms of cramping No effusion present Site is non tender Site is normal temperature   Tenderness with adduction of R leg across body at lateral aspect  Recommend RICE; use of Mobic and APAP and flexeril qHS and referral to SPORTS med

## 2022-01-29 NOTE — Progress Notes (Signed)
Established patient visit  I,Trevor Boyd,acting as a scribe for Gwyneth Sprout, FNP.,have documented all relevant documentation on the behalf of Gwyneth Sprout, FNP,as directed by  Gwyneth Sprout, FNP while in the presence of Gwyneth Sprout, FNP.   Patient: Trevor Boyd   DOB: 11-03-1960   61 y.o. Male  MRN: [id number] Visit Date: 01/29/2022  Today's healthcare provider: Gwyneth Sprout, FNP  Re Introduced to nurse practitioner role and practice setting.  All questions answered.  Discussed provider/patient relationship and expectations.   Chief Complaint  Patient presents with   Knee Pain   Subjective    HPI  Patient has had right knee pain for 3 weeks. Patient states right knee hurts to bend and in morning hurts to straighten out when he gets up. He knee does not hurt when he is walking, only when he bends it.  Medications: Outpatient Medications Prior to Visit  Medication Sig   sertraline (ZOLOFT) 100 MG tablet Take 1 tablet (100 mg total) by mouth daily.   No facility-administered medications prior to visit.    Review of Systems     Objective    BP 125/88 (BP Location: Left Arm, Patient Position: Sitting, Cuff Size: Large)   Pulse 81   Resp 16   Wt 215 lb (97.5 kg)   SpO2 96%   BMI 28.37 kg/m    Physical Exam Vitals and nursing note reviewed.  Constitutional:      Appearance: Normal appearance. He is overweight.  HENT:     Head: Normocephalic and atraumatic.  Eyes:     Pupils: Pupils are equal, round, and reactive to light.  Cardiovascular:     Rate and Rhythm: Normal rate and regular rhythm.     Pulses: Normal pulses.     Heart sounds: Normal heart sounds.  Pulmonary:     Effort: Pulmonary effort is normal.     Breath sounds: Normal breath sounds.  Musculoskeletal:        General: Normal range of motion.     Cervical back: Normal range of motion.     Right knee: Crepitus present. Tenderness present over the lateral joint line.  Skin:    General:  Skin is warm and dry.     Capillary Refill: Capillary refill takes less than 2 seconds.  Neurological:     General: No focal deficit present.     Mental Status: He is alert and oriented to person, place, and time. Mental status is at baseline.      No results found for any visits on 01/29/22.  Assessment & Plan     Problem List Items Addressed This Visit       Other   Lateral knee pain, right - Primary    Acute x 3 weeks Initially R knee gave out when patient was walking up stairs, patient caught himself, landing on hands, and did not fall on knees However, knee pain has continued since- with morning stiffness and difficulty rising from ground to truck which is lifted Hx of DVT- which had previous symptoms of cramping No effusion present Site is non tender Site is normal temperature   Tenderness with adduction of R leg across body at lateral aspect  Recommend RICE; use of Mobic and APAP and flexeril qHS and referral to SPORTS med       Relevant Medications   meloxicam (MOBIC) 15 MG tablet   cyclobenzaprine (FLEXERIL) 10 MG tablet   Other Relevant  Orders   Ambulatory referral to Sports Medicine     Return if symptoms worsen or fail to improve.      Vonna Kotyk, FNP, have reviewed all documentation for this visit. The documentation on 01/29/22 for the exam, diagnosis, procedures, and orders are all accurate and complete.    Gwyneth Sprout, [place] [phone] (phone) [phone] (fax)  [place]

## 2022-02-03 ENCOUNTER — Ambulatory Visit (INDEPENDENT_AMBULATORY_CARE_PROVIDER_SITE_OTHER): Payer: BC Managed Care – PPO | Admitting: Family Medicine

## 2022-02-03 ENCOUNTER — Ambulatory Visit
Admission: RE | Admit: 2022-02-03 | Discharge: 2022-02-03 | Disposition: A | Discharge status: Home / Self Care | Payer: BC Managed Care – PPO | Source: Ambulatory Visit | Attending: Family Medicine | Admitting: Family Medicine

## 2022-02-03 VITALS — BP 122/86 | Ht 73.0 in | Wt 220.0 lb

## 2022-02-03 DIAGNOSIS — M25561 Pain in right knee: Secondary | ICD-10-CM

## 2022-02-03 NOTE — Assessment & Plan Note (Signed)
Could consider previous meniscus tear versus degenerative meniscus tear, but otherwise very reassuring knee exam.  We discussed treatment options, given that he is not very limited by this during the day, would not need to jump to an MRI.  We did briefly discussed corticosteroid injection, but he has not had good improvement from pain from these in the past and would like to hold off.  We will proceed with home exercises for the knee, continuation of meloxicam, icing at the end of the day, continuation of compression sleeve as needed.  We will have him follow-up in 1 month and see how he is doing at that time.  We will also obtain baseline x-rays to assess any level of degeneration.

## 2022-02-03 NOTE — Patient Instructions (Signed)
Thank you for coming to see me today. It was a pleasure. Today we talked about:   I have placed an order for an x-ray of your knee.  Please go to [facility] to have this completed.  You do not need an appointment.  We will contact you with your results afterwards.   Continue to take the meloxicam.  You do not have to take the Flexeril anymore.  You can ice your knee at the end of the day.  Do the exercises that we have given you at least 5 times a week.  Please follow-up with [place] in 1 month or sooner as needed.  If you have any questions or concerns, please do not hesitate to call the office at [phone].  Best,   Arizona Constable, DO [place]

## 2022-02-03 NOTE — Progress Notes (Signed)
   Trevor Boyd is a 61 y.o. male who presents to [facility] today for the following:  Right knee pain Seen by PCP for this on 5/26 Reports morning stiffness No recent x-rays Pain is all over the anterior aspect of the knee States that he has not had any recent injuries, but 2 years ago he did have an injury during which he twisted his knee while at work He was able to rehab this rather quickly and did not have any issues since then States that the pain only occurs when he goes to get out of bed or when he is swinging in and out of a truck He is able to walk and especially do stairs without any difficulty When the pain first started he did have 2 episodes where he felt like his knee gave out, but he did not fall This occurred twice and occurred both times while he was on the stairs, but has not occurred since He has been taking meloxicam given to him by his primary care provider as well as Flexeril at night with minimal improvement He finds a compression sleeve to be helpful No locking, catching, swelling  PMH reviewed.  ROS as above. Medications reviewed.  Exam:  BP 122/86   Ht '6\' 1"'$  (1.854 m)   Wt 220 lb (99.8 kg)   BMI 29.03 kg/m  Gen: Well NAD MSK:  Right Knee: - Inspection: no gross deformity b/l. No swelling/effusion, erythema or bruising b/l. Skin intact - Palpation: no TTP b/l - ROM: full active ROM with flexion and extension in knee and hip b/l, there is a palpable lateral plica on the right knee - Strength: 5/5 strength b/l - Neuro/vasc: NV intact distally b/l - Special Tests: - LIGAMENTS: negative anterior and posterior drawer, negative Lachman's, no MCL or LCL laxity  -- MENISCUS: Equivocal McMurray's on right, negative Thessaly  -- PF JOINT: 1+ patellar crepitus.  Negative patellar grind, negative patellar apprehension.  Hips: normal ROM   No results found.   Assessment and Plan: 1) Right knee pain Could consider previous meniscus tear versus degenerative  meniscus tear, but otherwise very reassuring knee exam.  We discussed treatment options, given that he is not very limited by this during the day, would not need to jump to an MRI.  We did briefly discussed corticosteroid injection, but he has not had good improvement from pain from these in the past and would like to hold off.  We will proceed with home exercises for the knee, continuation of meloxicam, icing at the end of the day, continuation of compression sleeve as needed.  We will have him follow-up in 1 month and see how he is doing at that time.  We will also obtain baseline x-rays to assess any level of degeneration.   [place] Constable, D.O.  PGY-4 [facility]  02/03/2022 3:05 PM  Addendum:  I was the preceptor for this visit and available for immediate consultation.  Karlton Lemon MD [place]

## 2022-02-25 ENCOUNTER — Encounter: Payer: Self-pay | Admitting: Family Medicine

## 2022-02-26 ENCOUNTER — Other Ambulatory Visit: Payer: Self-pay | Admitting: Family Medicine

## 2022-02-26 DIAGNOSIS — K635 Polyp of colon: Secondary | ICD-10-CM

## 2022-03-01 ENCOUNTER — Other Ambulatory Visit: Payer: Self-pay

## 2022-03-01 DIAGNOSIS — Z86010 Personal history of colonic polyps: Secondary | ICD-10-CM

## 2022-03-01 MED ORDER — NA SULFATE-K SULFATE-MG SULF 17.5-3.13-1.6 GM/177ML PO SOLN: 1.0000 | Freq: Once | ORAL | 0 refills | Status: AC

## 2022-03-01 MED ORDER — DICLOFENAC SODIUM 75 MG PO TBEC: 75.0000 mg | DELAYED_RELEASE_TABLET | Freq: Two times a day (BID) | ORAL | 0 refills | Status: DC | PRN

## 2022-03-01 NOTE — Progress Notes (Signed)
Gastroenterology Pre-Procedure Review  Request Date: 04/02/2022 Requesting Physician: Dr. Allegra Lai  PATIENT REVIEW QUESTIONS: The patient responded to the following health history questions as indicated:    1. Are you having any GI issues? no 2. Do you have a personal history of Polyps? yes (last colonoscopy ) 3. Do you have a family history of Colon Cancer or Polyps? yes (polyps dad) 4. Diabetes Mellitus? no 5. Joint replacements in the past 12 months?no but did have wrist surgery back in october 6. Major health problems in the past 3 months?no 7. Any artificial heart valves, MVP, or defibrillator?no    MEDICATIONS & ALLERGIES:    Patient reports the following regarding taking any anticoagulation/antiplatelet therapy:   Plavix, Coumadin, Eliquis, Xarelto, Lovenox, Pradaxa, Brilinta, or Effient? no Aspirin? no  Patient confirms/reports the following medications:  Current Outpatient Medications  Medication Sig Dispense Refill   cyclobenzaprine (FLEXERIL) 10 MG tablet Take 1 tablet (10 mg total) by mouth at bedtime. 30 tablet 0   meloxicam (MOBIC) 15 MG tablet Take 1 tablet (15 mg total) by mouth daily. 30 tablet 1   Multiple Vitamins-Minerals (VITAMIN D3 COMPLETE) TABS Take 1,000 Int'l Units/day by mouth.     sertraline (ZOLOFT) 100 MG tablet Take 1 tablet (100 mg total) by mouth daily. 90 tablet 3   No current facility-administered medications for this visit.    Patient confirms/reports the following allergies:  No Known Allergies  No orders of the defined types were placed in this encounter.   AUTHORIZATION INFORMATION Primary Insurance: 1D#: Group #:  Secondary Insurance: 1D#: Group #:  SCHEDULE INFORMATION: Date:04/02/2022  Time: Location: [facility]

## 2022-03-03 ENCOUNTER — Ambulatory Visit: Payer: BC Managed Care – PPO | Admitting: Family Medicine

## 2022-03-26 ENCOUNTER — Encounter: Payer: Self-pay | Admitting: Physician Assistant

## 2022-03-26 ENCOUNTER — Ambulatory Visit (INDEPENDENT_AMBULATORY_CARE_PROVIDER_SITE_OTHER): Payer: BC Managed Care – PPO | Admitting: Physician Assistant

## 2022-03-26 VITALS — BP 124/82 | HR 74 | Temp 97.7°F | Ht 73.0 in | Wt 211.1 lb

## 2022-03-26 DIAGNOSIS — T63441A Toxic effect of venom of bees, accidental (unintentional), initial encounter: Secondary | ICD-10-CM | POA: Diagnosis not present

## 2022-03-26 MED ORDER — AMOXICILLIN 875 MG PO TABS: 875.0000 mg | ORAL_TABLET | Freq: Two times a day (BID) | ORAL | 0 refills | Status: DC

## 2022-03-26 MED ORDER — TRIAMCINOLONE ACETONIDE 0.1 % EX CREA: 1.0000 | TOPICAL_CREAM | Freq: Two times a day (BID) | CUTANEOUS | 0 refills | Status: DC

## 2022-03-26 NOTE — Progress Notes (Signed)
      Established patient visit   Patient: Trevor Boyd   DOB: Apr 07, 1961   61 y.o. Male  MRN: [id number] Visit Date: 03/26/2022  Today's healthcare provider: Mikey Kirschner, PA-C   Cc. Insect bite x 4 days  Subjective    HPI  Trevor Boyd is a 61 y/o male presents today with a bee sting on his L calf, 7/18. Reports initially felt warm raised, itchy. It has improved some, reports the area of redness has shrunk. Denies SOB, wheezing, other rash. Using otc benadryl cream. Pt states he dug around a bit to make sure he got the stinger out.  Medications: Outpatient Medications Prior to Visit  Medication Sig   cyclobenzaprine (FLEXERIL) 10 MG tablet Take 1 tablet (10 mg total) by mouth at bedtime.   diclofenac (VOLTAREN) 75 MG EC tablet Take 1 tablet (75 mg total) by mouth 2 (two) times daily as needed.   Multiple Vitamins-Minerals (VITAMIN D3 COMPLETE) TABS Take 1,000 Int'l Units/day by mouth.   sertraline (ZOLOFT) 100 MG tablet Take 1 tablet (100 mg total) by mouth daily.   No facility-administered medications prior to visit.    Review of Systems  Constitutional:  Negative for fatigue and fever.  Respiratory:  Negative for cough and shortness of breath.   Cardiovascular:  Negative for chest pain, palpitations and leg swelling.  Skin:  Positive for rash.  Neurological:  Negative for dizziness and headaches.       Objective    Blood pressure 124/82, pulse 74, temperature 97.7 F (36.5 C), temperature source Oral, height '6\' 1"'$  (1.854 m), weight 211 lb 1.6 oz (95.8 kg), SpO2 98 %.   Physical Exam Constitutional:      General: He is awake.     Appearance: He is well-developed.  HENT:     Head: Normocephalic.  Eyes:     Conjunctiva/sclera: Conjunctivae normal.  Cardiovascular:     Rate and Rhythm: Normal rate.  Pulmonary:     Effort: Pulmonary effort is normal.     Breath sounds: Normal breath sounds. No wheezing.  Skin:    General: Skin is warm.     Comments: L medial calf w/ a  5-6 cm flat area of dusky erythema with a central depression no discharge, warm to touch.  Neurological:     Mental Status: He is alert and oriented to person, place, and time.  Psychiatric:        Attention and Perception: Attention normal.        Mood and Affect: Mood normal.        Speech: Speech normal.        Behavior: Behavior is cooperative.      No results found for any visits on 03/26/22.  Assessment & Plan     Bee sting. Discussed steroids, but as the reaction is local and improving, no respiratory symptoms, decided against. Rx triamcinolone to use topically. May be an element of cellulitis-- rx amoxicillin 875 mg bid x 1 week. Advised pt if it continues to improve he may not need but as we are going into the weekend, sent to pharmacy if redness extended/painful/ raised.  I, Mikey Kirschner, PA-C have reviewed all documentation for this visit. The documentation on  03/26/2022 for the exam, diagnosis, procedures, and orders are all accurate and complete.  Mikey Kirschner, PA-C [facility] [address] [place], [place], [address] Office: [phone] Fax: [place]

## 2022-04-01 ENCOUNTER — Encounter: Payer: Self-pay | Admitting: Gastroenterology

## 2022-04-02 ENCOUNTER — Encounter
Admission: RE | Disposition: A | Discharge status: Home / Self Care | Payer: Self-pay | Source: Ambulatory Visit | Attending: Gastroenterology

## 2022-04-02 ENCOUNTER — Ambulatory Visit: Payer: BC Managed Care – PPO | Admitting: Anesthesiology

## 2022-04-02 ENCOUNTER — Ambulatory Visit
Admission: RE | Admit: 2022-04-02 | Discharge: 2022-04-02 | Disposition: A | Discharge status: Home / Self Care | Payer: BC Managed Care – PPO | Source: Ambulatory Visit | Attending: Gastroenterology | Admitting: Gastroenterology

## 2022-04-02 DIAGNOSIS — Z86010 Personal history of colonic polyps: Secondary | ICD-10-CM

## 2022-04-02 DIAGNOSIS — D125 Benign neoplasm of sigmoid colon: Secondary | ICD-10-CM | POA: Insufficient documentation

## 2022-04-02 DIAGNOSIS — K644 Residual hemorrhoidal skin tags: Secondary | ICD-10-CM | POA: Insufficient documentation

## 2022-04-02 DIAGNOSIS — Z1211 Encounter for screening for malignant neoplasm of colon: Secondary | ICD-10-CM | POA: Insufficient documentation

## 2022-04-02 DIAGNOSIS — F32A Depression, unspecified: Secondary | ICD-10-CM | POA: Insufficient documentation

## 2022-04-02 DIAGNOSIS — K635 Polyp of colon: Secondary | ICD-10-CM

## 2022-04-02 DIAGNOSIS — D126 Benign neoplasm of colon, unspecified: Secondary | ICD-10-CM | POA: Diagnosis not present

## 2022-04-02 DIAGNOSIS — K648 Other hemorrhoids: Secondary | ICD-10-CM | POA: Diagnosis not present

## 2022-04-02 HISTORY — PX: COLONOSCOPY WITH PROPOFOL: SHX5780

## 2022-04-02 SURGERY — COLONOSCOPY WITH PROPOFOL
Anesthesia: General

## 2022-04-02 MED ORDER — SODIUM CHLORIDE 0.9 % IV SOLN: INTRAVENOUS | Status: DC | PRN

## 2022-04-02 MED ORDER — PROPOFOL 500 MG/50ML IV EMUL
INTRAVENOUS | Status: DC | PRN
  Administered 2022-04-02: 180 ug/kg/min via INTRAVENOUS
  Administered 2022-04-02: 100 mg via INTRAVENOUS

## 2022-04-02 MED ORDER — SODIUM CHLORIDE 0.9 % IV SOLN
INTRAVENOUS | Status: DC
  Administered 2022-04-02: 20 mL/h via INTRAVENOUS

## 2022-04-02 MED ORDER — LIDOCAINE HCL (CARDIAC) PF 100 MG/5ML IV SOSY
PREFILLED_SYRINGE | INTRAVENOUS | Status: DC | PRN
  Administered 2022-04-02: 40 mg via INTRAVENOUS

## 2022-04-02 NOTE — Op Note (Signed)
[facility] Gastroenterology Patient Name: Trevor Boyd Procedure Date: 04/02/2022 12:34 PM MRN: [id number] Account #: [id number] Date of Birth: [birth year] Admit Type: Outpatient Age: 61 Room: [facility] ENDO ROOM 1 Gender: Male Note Status: Finalized Instrument Name: Colonoscope [id number] Procedure:             Colonoscopy Indications:           Screening for colorectal malignant neoplasm, Last                         colonoscopy: August 2021 Providers:             Lin Landsman MD, MD Referring MD:          Jaci Standard. Rollene Rotunda (Referring MD) Medicines:             General Anesthesia Complications:         No immediate complications. Estimated blood loss: None. Procedure:             Pre-Anesthesia Assessment:                        - Prior to the procedure, a History and Physical was                         performed, and patient medications and allergies were                         reviewed. The patient is competent. The risks and                         benefits of the procedure and the sedation options and                         risks were discussed with the patient. All questions                         were answered and informed consent was obtained.                         Patient identification and proposed procedure were                         verified by the physician, the nurse, the                         anesthesiologist, the anesthetist and the technician                         in the pre-procedure area in the procedure room in the                         endoscopy suite. Mental Status Examination: alert and                         oriented. Airway Examination: normal oropharyngeal                         airway and neck mobility. Respiratory Examination:  clear to auscultation. CV Examination: normal.                         Prophylactic Antibiotics: The patient does not require                         prophylactic antibiotics.  Prior Anticoagulants: The                         patient has taken no previous anticoagulant or                         antiplatelet agents. ASA Grade Assessment: II - A                         patient with mild systemic disease. After reviewing                         the risks and benefits, the patient was deemed in                         satisfactory condition to undergo the procedure. The                         anesthesia plan was to use general anesthesia.                         Immediately prior to administration of medications,                         the patient was re-assessed for adequacy to receive                         sedatives. The heart rate, respiratory rate, oxygen                         saturations, blood pressure, adequacy of pulmonary                         ventilation, and response to care were monitored                         throughout the procedure. The physical status of the                         patient was re-assessed after the procedure.                        After obtaining informed consent, the colonoscope was                         passed under direct vision. Throughout the procedure,                         the patient's blood pressure, pulse, and oxygen                         saturations were monitored continuously. The  Colonoscope was introduced through the anus and                         advanced to the the cecum, identified by appendiceal                         orifice and ileocecal valve. The colonoscopy was                         performed without difficulty. The patient tolerated                         the procedure well. The quality of the bowel                         preparation was evaluated using the BBPS [facility] Bowel                         Preparation Scale) with scores of: Right Colon = 3,                         Transverse Colon = 3 and Left Colon = 3 (entire mucosa                         seen well  with no residual staining, small fragments                         of stool or opaque liquid). The total BBPS score                         equals 9. Findings:      The perianal and digital rectal examinations were normal. Pertinent       negatives include normal sphincter tone and no palpable rectal lesions.      Two sessile polyps were found in the sigmoid colon. The polyps were 3 to       4 mm in size. These polyps were removed with a cold snare. Resection and       retrieval were complete.      Non-bleeding external hemorrhoids were found during retroflexion. The       hemorrhoids were medium-sized. Impression:            - Two 3 to 4 mm polyps in the sigmoid colon, removed                         with a cold snare. Resected and retrieved.                        - Non-bleeding external hemorrhoids. Recommendation:        - Discharge patient to home (with escort).                        - Resume previous diet today.                        - Continue present medications.                        -  Await pathology results.                        - Repeat colonoscopy in 7-10 years for surveillance                         based on pathology results. Procedure Code(s):     --- Professional ---                        [phone], Colonoscopy, flexible; with removal of                         tumor(s), polyp(s), or other lesion(s) by snare                         technique Diagnosis Code(s):     --- Professional ---                        K63.5, Polyp of colon                        K64.4, Residual hemorrhoidal skin tags                        Z12.11, Encounter for screening for malignant neoplasm                         of colon CPT copyright 2019 American Medical Association. All rights reserved. The codes documented in this report are preliminary and upon coder review may  be revised to meet current compliance requirements. Dr. Ulyess Mort Lin Landsman MD, MD 04/02/2022 12:54:09  PM This report has been signed electronically. Number of Addenda: 0 Note Initiated On: 04/02/2022 12:34 PM Scope Withdrawal Time: 0 hours 8 minutes 27 seconds  Total Procedure Duration: 0 hours 11 minutes 32 seconds  Estimated Blood Loss:  Estimated blood loss: none.      [facility]

## 2022-04-02 NOTE — Anesthesia Postprocedure Evaluation (Signed)
Anesthesia Post Note  Patient: Trevor Boyd  Procedure(s) Performed: COLONOSCOPY WITH PROPOFOL  Patient location during evaluation: PACU Anesthesia Type: General Level of consciousness: awake and awake and alert Pain management: pain level controlled Vital Signs Assessment: post-procedure vital signs reviewed and stable Respiratory status: spontaneous breathing and nonlabored ventilation Cardiovascular status: stable Anesthetic complications: no   No notable events documented.   Last Vitals:  Vitals:   04/02/22 1029 04/02/22 1257  BP: (!) 120/91 99/65  Pulse: 78   Resp: 20   Temp: (!) 35.6 C 36.8 C  SpO2: 96%     Last Pain:  Vitals:   04/02/22 1257  TempSrc: Temporal  PainSc: Asleep                 VAN STAVEREN,Lizmary Nader

## 2022-04-02 NOTE — Transfer of Care (Signed)
Immediate Anesthesia Transfer of Care Note  Patient: Trevor Boyd  Procedure(s) Performed: COLONOSCOPY WITH PROPOFOL  Patient Location: PACU and Endoscopy Unit  Anesthesia Type:General  Level of Consciousness: drowsy  Airway & Oxygen Therapy: Patient Spontanous Breathing  Post-op Assessment: Report given to RN  Post vital signs: Reviewed and stable  Last Vitals:  Vitals Value Taken Time  BP 99/65 04/02/22 1258  Temp    Pulse 62 04/02/22 1258  Resp 18 04/02/22 1258  SpO2 95 % 04/02/22 1258  Vitals shown include unvalidated device data.  Last Pain:  Vitals:   04/02/22 1029  TempSrc: Temporal  PainSc: 0-No pain         Complications: No notable events documented.

## 2022-04-02 NOTE — Anesthesia Preprocedure Evaluation (Signed)
Anesthesia Evaluation  Patient identified by MRN, date of birth, ID band Patient awake    Reviewed: Allergy & Precautions, NPO status , Patient's Chart, lab work & pertinent test results  Airway Mallampati: II  TM Distance: >3 FB Neck ROM: Full    Dental  (+) Teeth Intact, Chipped, Dental Advisory Given   Pulmonary neg pulmonary ROS,    Pulmonary exam normal breath sounds clear to auscultation       Cardiovascular Exercise Tolerance: Good negative cardio ROS Normal cardiovascular exam Rhythm:Regular Rate:Normal     Neuro/Psych Depression negative neurological ROS  negative psych ROS   GI/Hepatic negative GI ROS, Neg liver ROS,   Endo/Other  negative endocrine ROS  Renal/GU negative Renal ROS  negative genitourinary   Musculoskeletal   Abdominal Normal abdominal exam  (+)   Peds negative pediatric ROS (+)  Hematology negative hematology ROS (+)   Anesthesia Other Findings Past Medical History: No date: Depression 08/08/2019: DVT (deep venous thrombosis) (HCC)     Comment:  Right calf, behind knee, and right thigh No date: Osteoarthritis of right knee  Past Surgical History: 07/20/2013: COLONOSCOPY     Comment:  Dr. Arther Dames, [facility]; Impression: one 81m polyp in the              rectum. Lymphoid aggregate. Repeat 07/25/2018 Dr. RRayann Heman8/02/2020: COLONOSCOPY WITH PROPOFOL; N/A     Comment:  Procedure: COLONOSCOPY WITH PROPOFOL;  Surgeon:               TVirgel Manifold MD;  Location: [facility] ENDOSCOPY;                Service: Gastroenterology;  Laterality: N/A;  BMI    Body Mass Index: 27.71 kg/m      Reproductive/Obstetrics negative OB ROS                             Anesthesia Physical Anesthesia Plan  ASA: 2  Anesthesia Plan: General   Post-op Pain Management:    Induction: Intravenous  PONV Risk Score and Plan: Propofol infusion and TIVA  Airway Management Planned:  Natural Airway  Additional Equipment:   Intra-op Plan:   Post-operative Plan:   Informed Consent: I have reviewed the patients History and Physical, chart, labs and discussed the procedure including the risks, benefits and alternatives for the proposed anesthesia with the patient or authorized representative who has indicated his/her understanding and acceptance.     Dental Advisory Given  Plan Discussed with: CRNA and Surgeon  Anesthesia Plan Comments:         Anesthesia Quick Evaluation

## 2022-04-02 NOTE — H&P (Signed)
Trevor Darby, MD [address]  [place]  [place], [place] [address]  Main: [phone]  Fax: [phone] Pager: [phone]  Primary Care Physician:  Gwyneth Sprout, FNP Primary Gastroenterologist:  Dr. Cephas Boyd  Pre-Procedure History & Physical: HPI:  Trevor Boyd is a 61 y.o. male is here for an colonoscopy.   Past Medical History:  Diagnosis Date   Depression    DVT (deep venous thrombosis) ([place]) 08/08/2019   Right calf, behind knee, and right thigh   Osteoarthritis of right knee     Past Surgical History:  Procedure Laterality Date   COLONOSCOPY  07/20/2013   Dr. Arther Dames, [facility]; Impression: one 45m polyp in the rectum. Lymphoid aggregate. Repeat 07/25/2018 Dr. RRayann Heman  COLONOSCOPY WITH PROPOFOL N/A 04/11/2020   Procedure: COLONOSCOPY WITH PROPOFOL;  Surgeon: TVirgel Manifold MD;  [facility];  Service: Gastroenterology;  Laterality: N/A;    Prior to Admission medications   Medication Sig Start Date End Date Taking? Authorizing Provider  amoxicillin (AMOXIL) 875 MG tablet Take 1 tablet (875 mg total) by mouth 2 (two) times daily for 7 days. 03/26/22 04/02/22 Yes Drubel, LRia Comment PA-C  cyclobenzaprine (FLEXERIL) 10 MG tablet Take 1 tablet (10 mg total) by mouth at bedtime. 01/29/22  Yes PGwyneth Sprout FNP  diclofenac (VOLTAREN) 75 MG EC tablet Take 1 tablet (75 mg total) by mouth 2 (two) times daily as needed. 03/01/22  Yes Meccariello, BBernita Raisin DO  Multiple Vitamins-Minerals (VITAMIN D3 COMPLETE) TABS Take 1,000 Int'l Units/day by mouth.   Yes [provider]  sertraline (ZOLOFT) 100 MG tablet Take 1 tablet (100 mg total) by mouth daily. 06/02/21  Yes PGwyneth Sprout FNP  triamcinolone cream (KENALOG) 0.1 % Apply 1 Application topically 2 (two) times daily. 03/26/22  Yes DMikey Kirschner PA-C    Allergies as of 03/01/2022   (No Known Allergies)    Family History  Problem Relation Age of Onset   Hypertension Mother     Hypertension Sister    Heart attack Maternal Grandmother    Heart attack Maternal Grandfather     Social History   Socioeconomic History   Marital status: Married    Spouse name: Not on file   Number of children: Not on file   Years of education: Not on file   Highest education level: Not on file  Occupational History   Not on file  Tobacco Use   Smoking status: Never   Smokeless tobacco: Never  Substance and Sexual Activity   Alcohol use: No    Alcohol/week: 0.0 standard drinks of alcohol   Drug use: No   Sexual activity: Yes    Partners: Female  Other Topics Concern   Not on file  Social History Narrative   Not on file   Social Determinants of Health   Financial Resource Strain: Not on file  Food Insecurity: Not on file  Transportation Needs: Not on file  Physical Activity: Not on file  Stress: Not on file  Social Connections: Not on file  Intimate Partner Violence: Not on file    Review of Systems: See HPI, otherwise negative ROS  Physical Exam: BP (!) 120/91   Pulse 78   Temp (!) 96.1 F (35.6 C) (Temporal)   Resp 20   Ht '6\' 1"'$  (1.854 m)   Wt 95.3 kg   SpO2 96%   BMI 27.71 kg/m  General:   Alert,  pleasant and cooperative in NAD Head:  Normocephalic and atraumatic. Neck:  Supple; no masses or thyromegaly. Lungs:  Clear throughout to auscultation.    Heart:  Regular rate and rhythm. Abdomen:  Soft, nontender and nondistended. Normal bowel sounds, without guarding, and without rebound.   Neurologic:  Alert and  oriented x4;  grossly normal neurologically.  Impression/Plan: Trevor Boyd is here for an colonoscopy to be performed for colon cancer screening  Risks, benefits, limitations, and alternatives regarding  colonoscopy have been reviewed with the patient.  Questions have been answered.  All parties agreeable.   Sherri Sear, MD  04/02/2022, 11:38 AM

## 2022-04-05 ENCOUNTER — Encounter: Payer: Self-pay | Admitting: Gastroenterology

## 2022-04-05 LAB — SURGICAL PATHOLOGY

## 2022-05-17 ENCOUNTER — Ambulatory Visit
Admission: RE | Admit: 2022-05-17 | Discharge: 2022-05-17 | Disposition: A | Discharge status: Home / Self Care | Payer: BC Managed Care – PPO | Source: Ambulatory Visit | Attending: Family Medicine | Admitting: Family Medicine

## 2022-05-17 ENCOUNTER — Ambulatory Visit: Payer: Self-pay | Admitting: *Deleted

## 2022-05-17 ENCOUNTER — Ambulatory Visit (INDEPENDENT_AMBULATORY_CARE_PROVIDER_SITE_OTHER): Payer: BC Managed Care – PPO | Admitting: Family Medicine

## 2022-05-17 VITALS — BP 119/84 | HR 70 | Temp 97.8°F | Resp 16 | Wt 212.0 lb

## 2022-05-17 DIAGNOSIS — M79662 Pain in left lower leg: Secondary | ICD-10-CM

## 2022-05-17 NOTE — Progress Notes (Signed)
      Established patient visit   Patient: Trevor Boyd   DOB: 10/16/1960   61 y.o. Male  MRN: [id number] Visit Date: 05/17/2022  Today's healthcare provider: Lelon Huh, MD   No chief complaint on file.  Subjective    HPI  Patient is a 61 year old male who presents with complaint of left calf pain.  Pain started this morning and lasted about 1.2 hours.  He states on the way over to the office he had the same pain with it lasting about 30 seconds.  Then, on his way into the office it has started hurting him again.   He denies any radiation of pain up or down the leg.  It does not interfere with his gait.  He has had no known trauma tot he leg and there has been no redness, swelling or heat in the area of the pain.  He denies any shortness of breath or chest pain. 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    He has no family history of clotting disorder  Medications: Outpatient Medications Prior to Visit  Medication Sig   cyclobenzaprine (FLEXERIL) 10 MG tablet Take 1 tablet (10 mg total) by mouth at bedtime.   sertraline (ZOLOFT) 100 MG tablet Take 1 tablet (100 mg total) by mouth daily.   triamcinolone cream (KENALOG) 0.1 % Apply 1 Application topically 2 (two) times daily.   Multiple Vitamins-Minerals (VITAMIN D3 COMPLETE) TABS Take 1,000 Int'l Units/day by mouth.   No facility-administered medications prior to visit.    Review of Systems  Respiratory: Negative.  Negative for cough, shortness of breath and wheezing.   Cardiovascular:  Negative for chest pain, palpitations and leg swelling.  Neurological:  Negative for weakness and headaches.       Objective    BP 119/84 (BP Location: Left Arm, Patient Position: Sitting, Cuff Size: Large)   Pulse 70   Temp 97.8 F (36.6 C) (Oral)   Resp 16   Wt 212 lb (96.2 kg)   BMI 27.97 kg/m    Physical Exam  Slight left calf tenderness. No swelling or erythema.   Assessment & Plan     1. Pain of left  calf  - US Venous Img Lower Unilateral Left; Future      The entirety of the information documented in the History of Present Illness, Review of Systems and Physical Exam were personally obtained by me. Portions of this information were initially documented by the CMA and reviewed by me for thoroughness and accuracy.     Lelon Huh, MD  [facility] [phone] (phone) [phone] (fax)  [place]

## 2022-05-17 NOTE — Telephone Encounter (Signed)
  Chief Complaint: left leg pain in calf Symptoms: left calf pain this am now gone. Hx blood clot in right leg in the past reports cramping in calf x 2 hours . Left leg warmer than normal .  Frequency: this am at 7  Pertinent Negatives: Patient denies chest pain no difficulty breathing. No fever, no rash. No swelling in left calf area.  Disposition: '[]'$ ED /'[]'$ Urgent Care (no appt availability in office) / '[x]'$ Appointment(In office/virtual)/ '[]'$  [place] [facility]/ '[]'$ Home Care/ '[]'$ Refused Recommended Disposition /'[]'$ [place] [facility]/ '[]'$  Follow-up with PCP Additional Notes:   Appt scheduled today per protocol.    Reason for Disposition  History of prior "blood clot" in leg or lungs (i.e., deep vein thrombosis, pulmonary embolism)  Answer Assessment - Initial Assessment Questions 1. ONSET: "When did the pain start?"      This am at 7  2. LOCATION: "Where is the pain located?"      Left calf  3. PAIN: "How bad is the pain?"    (Scale 1-10; or mild, moderate, severe)   -  MILD (1-3): doesn't interfere with normal activities    -  MODERATE (4-7): interferes with normal activities (e.g., work or school) or awakens from sleep, limping    -  SEVERE (8-10): excruciating pain, unable to do any normal activities, unable to walk     Mild  4. WORK OR EXERCISE: "Has there been any recent work or exercise that involved this part of the body?"      na 5. CAUSE: "What do you think is causing the leg pain?"     Not sure hx blood clot  6. OTHER SYMPTOMS: "Do you have any other symptoms?" (e.g., chest pain, back pain, breathing difficulty, swelling, rash, fever, numbness, weakness)     No  7. PREGNANCY: "Is there any chance you are pregnant?" "When was your last menstrual period?"     na  Protocols used: Leg Pain-A-AH

## 2022-05-19 ENCOUNTER — Ambulatory Visit (INDEPENDENT_AMBULATORY_CARE_PROVIDER_SITE_OTHER): Payer: BC Managed Care – PPO | Admitting: Sports Medicine

## 2022-05-19 VITALS — BP 113/79 | Ht 72.0 in | Wt 212.0 lb

## 2022-05-19 DIAGNOSIS — M1711 Unilateral primary osteoarthritis, right knee: Secondary | ICD-10-CM

## 2022-05-19 MED ORDER — DICLOFENAC SODIUM 75 MG PO TBEC: 75.0000 mg | DELAYED_RELEASE_TABLET | Freq: Two times a day (BID) | ORAL | 1 refills | Status: DC

## 2022-05-19 NOTE — Patient Instructions (Signed)
I refilled your diclofenac today Take it as needed for knee pain Continue Tylenol or Voltaren gel also as needed for pain Continue working on quad strengthening and walking Follow-up with [place] if the medicines stop being effective

## 2022-05-19 NOTE — Progress Notes (Signed)
  Trevor Boyd - 61 y.o. male MRN [id number]  Date of birth: [birth year]    CHIEF COMPLAINT:   med refill    SUBJECTIVE:   HPI:  Pleasant 61 year old male comes to clinic for a refill of his medication.  He has mild right knee osteoarthritis.  Over the last several months he has been taking diclofenac twice daily as needed for knee pain and also wearing a knee brace.  Today says the knee is feeling good without any pain.  He would like to continue taking diclofenac as needed for now.  He is not interested in trying injections yet.  ROS:     See HPI  PERTINENT  PMH / PSH FH / / SH:  Past Medical, Surgical, Social, and Family History Reviewed & Updated in the EMR.  Pertinent findings include:  Knee OA  OBJECTIVE: BP 113/79   Ht 6' (1.829 m)   Wt 212 lb (96.2 kg)   BMI 28.75 kg/m   Physical Exam:  Vital signs are reviewed.  GEN: Alert and oriented, NAD Pulm: Breathing unlabored PSY: normal mood, congruent affect  MSK: R Knee -no erythema or effusion.  Nontender to palpation medial lateral joint line or patella.  Negative patellar apprehension.  Full range of motion flexion extension.  No instability with valgus or varus stressing.  Negative Thessaly test.  Neurovascularly intact distally  ASSESSMENT & PLAN:  1. R Knee OA -Continue diclofenac twice daily as needed for pain.  Refill sent to his pharmacy today.  We discussed that ideally this not be a long-term medication to protect his stomach lining and kidney function.  Encouraged use of Tylenol or Voltaren as adjunct treatments.  If this stops being effective he will follow-up to discuss injections.   Dortha Kern, MD PGY-4, Sports Medicine Fellow New   Addendum:  Patient seen in the office by fellow.  His history, exam, plan of care were precepted with me.  Trevor Lemon MD Kirt Boys

## 2022-05-20 ENCOUNTER — Encounter: Payer: Self-pay | Admitting: Sports Medicine

## 2022-08-13 ENCOUNTER — Other Ambulatory Visit: Payer: Self-pay | Admitting: Family Medicine

## 2022-08-13 DIAGNOSIS — F439 Reaction to severe stress, unspecified: Secondary | ICD-10-CM

## 2022-08-13 MED ORDER — SERTRALINE HCL 100 MG PO TABS: 100.0000 mg | ORAL_TABLET | Freq: Every day | ORAL | 0 refills | Status: DC

## 2022-08-13 NOTE — Telephone Encounter (Signed)
Requested Prescriptions  Pending Prescriptions Disp Refills   sertraline (ZOLOFT) 100 MG tablet 90 tablet 0    Sig: Take 1 tablet (100 mg total) by mouth daily.     Psychiatry:  Antidepressants - SSRI - sertraline Failed - 08/13/2022  2:33 PM      Failed - AST in normal range and within 360 days    AST  Date Value Ref Range Status  06/02/2021 20 0 - 40 IU/L Final         Failed - ALT in normal range and within 360 days    ALT  Date Value Ref Range Status  06/02/2021 20 0 - 44 IU/L Final         Passed - Completed PHQ-2 or PHQ-9 in the last 360 days      Passed - Valid encounter within last 6 months    Recent Outpatient Visits           2 months ago Pain of left calf   [facility] Birdie Sons, MD   4 months ago Bee sting, accidental or unintentional, initial encounter   [facility] Thedore Mins, [place], PA-C   6 months ago Lateral knee pain, right   [facility] Gwyneth Sprout, FNP   1 year ago Annual physical exam   [facility] Gwyneth Sprout, FNP   1 year ago [place], Donald E, MD

## 2022-08-13 NOTE — Telephone Encounter (Signed)
Medication Refill - Medication: sertraline (ZOLOFT) 100 MG tablet   Has the patient contacted their pharmacy? No. (Agent: If no, request that the patient contact the pharmacy for the refill. If patient does not wish to contact the pharmacy document the reason why and proceed with request.) (Agent: If yes, when and what did the pharmacy advise?)  Preferred Pharmacy (with phone number or street name): [facility], [place] [address] [place] Phone: [phone]  Fax: [phone]   Has the patient been seen for an appointment in the last year OR does the patient have an upcoming appointment? Yes.    Agent: Please be advised that RX refills may take up to 3 business days. We ask that you follow-up with your pharmacy.

## 2022-08-21 ENCOUNTER — Other Ambulatory Visit: Payer: Self-pay | Admitting: Family Medicine

## 2022-08-21 DIAGNOSIS — M25561 Pain in right knee: Secondary | ICD-10-CM

## 2022-08-23 NOTE — Telephone Encounter (Signed)
Dc'd 03/01/22 Change in therapy by Jolinda Croak LAT  Requested Prescriptions  Refused Prescriptions Disp Refills   meloxicam (MOBIC) 15 MG tablet [Pharmacy Med Name: MELOXICAM 15MG TABLETS] 30 tablet 1    Sig: TAKE 1 TABLET(15 MG) BY MOUTH DAILY     Analgesics:  COX2 Inhibitors Failed - 08/21/2022 12:16 PM      Failed - Manual Review: Labs are only required if the patient has taken medication for more than 8 weeks.      Failed - HGB in normal range and within 360 days    Hemoglobin  Date Value Ref Range Status  06/02/2021 16.4 13.0 - 17.7 g/dL Final         Failed - Cr in normal range and within 360 days    Creatinine, Ser  Date Value Ref Range Status  06/02/2021 1.09 0.76 - 1.27 mg/dL Final         Failed - HCT in normal range and within 360 days    Hematocrit  Date Value Ref Range Status  06/02/2021 48.0 37.5 - 51.0 % Final         Failed - AST in normal range and within 360 days    AST  Date Value Ref Range Status  06/02/2021 20 0 - 40 IU/L Final         Failed - ALT in normal range and within 360 days    ALT  Date Value Ref Range Status  06/02/2021 20 0 - 44 IU/L Final         Failed - eGFR is 30 or above and within 360 days    GFR calc Af Amer  Date Value Ref Range Status  10/10/2019 >60 >60 mL/min Final   GFR calc non Af Amer  Date Value Ref Range Status  10/10/2019 >60 >60 mL/min Final   eGFR  Date Value Ref Range Status  06/02/2021 78 >59 mL/min/1.73 Final         Passed - Patient is not pregnant      Passed - Valid encounter within last 12 months    Recent Outpatient Visits           3 months ago Pain of left calf   [facility] Birdie Sons, MD   5 months ago Bee sting, accidental or unintentional, initial encounter   [facility] Thedore Mins, [place], PA-C   6 months ago Lateral knee pain, right   [facility] Gwyneth Sprout, FNP   1 year ago Annual physical exam   [facility] Gwyneth Sprout, FNP   1 year ago [place], Donald E, MD

## 2022-08-26 ENCOUNTER — Other Ambulatory Visit: Payer: Self-pay | Admitting: Family Medicine

## 2022-08-26 ENCOUNTER — Encounter: Payer: Self-pay | Admitting: Family Medicine

## 2022-08-26 MED ORDER — MELOXICAM 15 MG PO TABS: 15.0000 mg | ORAL_TABLET | Freq: Every day | ORAL | 0 refills | Status: DC

## 2022-09-05 ENCOUNTER — Encounter: Payer: Self-pay | Admitting: Sports Medicine

## 2022-09-08 ENCOUNTER — Ambulatory Visit (INDEPENDENT_AMBULATORY_CARE_PROVIDER_SITE_OTHER): Payer: BC Managed Care – PPO | Admitting: Family Medicine

## 2022-09-08 ENCOUNTER — Encounter: Payer: Self-pay | Admitting: Family Medicine

## 2022-09-08 VITALS — BP 142/99 | HR 79 | Temp 98.0°F | Resp 16 | Ht 73.0 in | Wt 216.0 lb

## 2022-09-08 DIAGNOSIS — R351 Nocturia: Secondary | ICD-10-CM

## 2022-09-08 DIAGNOSIS — R030 Elevated blood-pressure reading, without diagnosis of hypertension: Secondary | ICD-10-CM

## 2022-09-08 DIAGNOSIS — M25561 Pain in right knee: Secondary | ICD-10-CM

## 2022-09-08 DIAGNOSIS — R413 Other amnesia: Secondary | ICD-10-CM

## 2022-09-08 DIAGNOSIS — Z13220 Encounter for screening for lipoid disorders: Secondary | ICD-10-CM

## 2022-09-08 DIAGNOSIS — G8929 Other chronic pain: Secondary | ICD-10-CM

## 2022-09-08 DIAGNOSIS — R7303 Prediabetes: Secondary | ICD-10-CM

## 2022-09-08 LAB — POCT URINALYSIS DIPSTICK
Bilirubin, UA: NEGATIVE
Blood, UA: NEGATIVE
Glucose, UA: NEGATIVE
Ketones, UA: NEGATIVE
Leukocytes, UA: NEGATIVE
Nitrite, UA: NEGATIVE
Protein, UA: NEGATIVE
Spec Grav, UA: 1.015 (ref 1.010–1.025)
Urobilinogen, UA: 0.2 E.U./dL
pH, UA: 6.5 (ref 5.0–8.0)

## 2022-09-08 NOTE — Assessment & Plan Note (Signed)
BP elevated; pt notes stress from work day BP goal of <140/<90 given hx of prediabetes RTC if symptoms change Recommend cuff to check BP

## 2022-09-08 NOTE — Progress Notes (Signed)
I,Joseline E Rosas,acting as a scribe for Gwyneth Sprout, FNP.,have documented all relevant documentation on the behalf of Gwyneth Sprout, FNP,as directed by  Gwyneth Sprout, FNP while in the presence of Gwyneth Sprout, FNP.   Established patient visit   Patient: Trevor Boyd   DOB: [birth year]   62 y.o. Male  MRN: [id number] Visit Date: 09/08/2022  Today's healthcare provider: Gwyneth Sprout, FNP  Re Introduced to nurse practitioner role and practice setting.  All questions answered.  Discussed provider/patient relationship and expectations.  Chief Complaint  Patient presents with   Urinary Incontinence   Memory Issues   Knee Pain   Subjective    HPI  Patient here with concerns of urine incontinence and more frequent urination at night. Reports his urine for the past few days have been foamy. Patient thinks is coming from Meloxicam medication. Patient reports no Dysuria,hematuria. Reports that for the past 2 weeks he has been waking up in the morning with lower back pain. Reports that once he gets moving his back feels fine.  Patients reports that his wife is concerns about his memory/disoriented at time.  Knee Pain: reports that he would like to have knee injections. Overall the Meloxicam is helping but would like to proceed with the injections.     09/08/2022    3:07 PM  MMSE - Mini Mental State Exam  Orientation to time 4  Orientation to Place 5  Registration 3  Attention/ Calculation 5  Recall 3  Language- name 2 objects 2  Language- repeat 1  Language- follow 3 step command 3  Language- read & follow direction 1  Write a sentence 1  Copy design 1  Total score 29        09/08/2022    3:06 PM  6CIT Screen  What Year? 0 points  What month? 0 points  What time? 0 points  Count back from 20 0 points  Months in reverse 0 points  Repeat phrase 6 points  Total Score 6 points          09/08/2022    3:07 PM  MMSE - Mini Mental State Exam  Orientation to time 4   Orientation to Place 5  Registration 3  Attention/ Calculation 5  Recall 3  Language- name 2 objects 2  Language- repeat 1  Language- follow 3 step command 3  Language- read & follow direction 1  Write a sentence 1  Copy design 1  Total score 29     Medications: Outpatient Medications Prior to Visit  Medication Sig   meloxicam (MOBIC) 15 MG tablet Take 1 tablet (15 mg total) by mouth daily.   sertraline (ZOLOFT) 100 MG tablet Take 1 tablet (100 mg total) by mouth daily.   [DISCONTINUED] cyclobenzaprine (FLEXERIL) 10 MG tablet Take 1 tablet (10 mg total) by mouth at bedtime.   [DISCONTINUED] diclofenac (VOLTAREN) 75 MG EC tablet Take 1 tablet (75 mg total) by mouth 2 (two) times daily.   [DISCONTINUED] triamcinolone cream (KENALOG) 0.1 % Apply 1 Application topically 2 (two) times daily.   No facility-administered medications prior to visit.    Review of Systems    Objective    BP (!) 142/99 (BP Location: Right Arm, Patient Position: Sitting, Cuff Size: Normal)   Pulse 79   Temp 98 F (36.7 C) (Oral)   Resp 16   Ht '6\' 1"'$  (1.854 m)   Wt 216 lb (98 kg)   BMI  28.50 kg/m   Physical Exam Vitals and nursing note reviewed.  Constitutional:      Appearance: Normal appearance. He is well-groomed and overweight.  HENT:     Head: Normocephalic and atraumatic.  Eyes:     Pupils: Pupils are equal, round, and reactive to light.  Cardiovascular:     Rate and Rhythm: Normal rate and regular rhythm.     Pulses: Normal pulses.     Heart sounds: Normal heart sounds.  Pulmonary:     Effort: Pulmonary effort is normal.     Breath sounds: Normal breath sounds.  Musculoskeletal:        General: Normal range of motion.     Cervical back: Normal range of motion.  Skin:    General: Skin is warm and dry.     Capillary Refill: Capillary refill takes less than 2 seconds.  Neurological:     General: No focal deficit present.     Mental Status: He is alert and oriented to person,  place, and time. Mental status is at baseline.  Psychiatric:        Mood and Affect: Mood is anxious.        Behavior: Behavior normal.        Thought Content: Thought content normal.        Judgment: Judgment normal.     Comments: "I just got off work"     Results for orders placed or performed in visit on 09/08/22  POCT urinalysis dipstick  Result Value Ref Range   Color, UA Yellow    Clarity, UA Clear    Glucose, UA Negative Negative   Bilirubin, UA Negative    Ketones, UA Negative    Spec Grav, UA 1.015 1.010 - 1.025   Blood, UA Negative    pH, UA 6.5 5.0 - 8.0   Protein, UA Negative Negative   Urobilinogen, UA 0.2 0.2 or 1.0 E.U./dL   Nitrite, UA Negative    Leukocytes, UA Negative Negative   Appearance     Odor      Assessment & Plan     Problem List Items Addressed This Visit       Other   Elevated blood pressure reading in office without diagnosis of hypertension    BP elevated; pt notes stress from work day BP goal of <140/<90 given hx of prediabetes RTC if symptoms change Recommend cuff to check BP      Relevant Orders   Comprehensive Metabolic Panel (CMET)   CBC   Frequent urination at night - Primary    Reports dreams of waking to urinate; but pt is actually asleep Ends up wetting his boxers; requiring a change Will check PSA and CBC, TSH, A1c UA negative; reassuring       Relevant Orders   POCT urinalysis dipstick (Completed)   Hemoglobin A1c   PSA   Memory loss    Acute, per wife Declines neuro referral at this time given in office testing WDL Will complete vitamin screening as well Pt notes that he may be "not listening"; denies concern regarding employment      Relevant Orders   Vitamin B1   B12 and Folate Panel   Vitamin B6   Vitamin D (25 hydroxy)   Prediabetes    Chronic, stable previously Repeat A1c given urinary complaints Continue to recommend balanced, lower carb meals. Smaller meal size, adding snacks. Choosing water as  drink of choice and increasing purposeful exercise. Controlled with diet/exercise Goal <7%  Relevant Orders   Hemoglobin A1c   Right knee pain    Acute on chronic, Mobic 15 mg is assisting; wishes to be seen further for knee injections Referral placed      Relevant Orders   Ambulatory referral to Orthopedic Surgery   Screening cholesterol level    Recommend LP given hx of prediabetes and elevated BP in office today recommend diet low in saturated fat and regular exercise - 30 min at least 5 times per week       Relevant Orders   Lipid panel   Return in about 6 months (around 03/09/2023), or if symptoms worsen or fail to improve, for annual examination.     Vonna Kotyk, FNP, have reviewed all documentation for this visit. The documentation on 09/08/22 for the exam, diagnosis, procedures, and orders are all accurate and complete.  Gwyneth Sprout, [place] [phone] (phone) [phone] (fax)  [place]

## 2022-09-08 NOTE — Assessment & Plan Note (Signed)
Acute on chronic, Mobic 15 mg is assisting; wishes to be seen further for knee injections Referral placed

## 2022-09-08 NOTE — Assessment & Plan Note (Signed)
Recommend LP given hx of prediabetes and elevated BP in office today recommend diet low in saturated fat and regular exercise - 30 min at least 5 times per week

## 2022-09-08 NOTE — Assessment & Plan Note (Signed)
Acute, per wife Declines neuro referral at this time given in office testing WDL Will complete vitamin screening as well Pt notes that he may be "not listening"; denies concern regarding employment

## 2022-09-08 NOTE — Assessment & Plan Note (Signed)
Reports dreams of waking to urinate; but pt is actually asleep Ends up wetting his boxers; requiring a change Will check PSA and CBC, TSH, A1c UA negative; reassuring

## 2022-09-08 NOTE — Assessment & Plan Note (Signed)
Chronic, stable previously Repeat A1c given urinary complaints Continue to recommend balanced, lower carb meals. Smaller meal size, adding snacks. Choosing water as drink of choice and increasing purposeful exercise. Controlled with diet/exercise Goal <7%

## 2022-09-08 NOTE — Patient Instructions (Signed)
The CDC recommends two doses of Shingrix (the new shingles vaccine) separated by 2 to 6 months for adults age 62 years and older. I recommend checking with your insurance plan regarding coverage for this vaccine.    

## 2022-09-09 ENCOUNTER — Encounter: Payer: Self-pay | Admitting: Family Medicine

## 2022-09-09 ENCOUNTER — Other Ambulatory Visit: Payer: Self-pay | Admitting: Family Medicine

## 2022-09-09 DIAGNOSIS — R7989 Other specified abnormal findings of blood chemistry: Secondary | ICD-10-CM

## 2022-09-09 NOTE — Progress Notes (Signed)
Significant decrease in kidney function from 1 year ago. Recommend cessation of NSAIDs including Mobic. Referral was placed to Orthopedics to assist with knee pain and further care request for injection therapies. Recommend repeat BMP in 1 month. Recommend water intake at 64 oz/day.  A1c remains stable in pre-diabetic range. Continue to recommend balanced, lower carb meals. Smaller meal size, adding snacks. Choosing water as drink of choice and increasing purposeful exercise.  Cholesterol has further improved. Risk of cardiovascular event remains elevated at 10% in 10 years. I continue to recommend diet low in saturated fat and regular exercise - 30 min at least 5 times per week  The 10-year ASCVD risk score (Arnett DK, et al., 2019) is: 10.1%   Values used to calculate the score:     Age: 62 years     Sex: Male     Is Non-Hispanic African American: No     Diabetic: No     Tobacco smoker: No     Systolic Blood Pressure: 401 mmHg     Is BP treated: No     HDL Cholesterol: 38 mg/dL     Total Cholesterol: 143 mg/dL  Vitamins remain stable (those who have returned) as well as cell count.

## 2022-09-10 ENCOUNTER — Other Ambulatory Visit: Payer: Self-pay | Admitting: Family Medicine

## 2022-09-10 DIAGNOSIS — N1831 Chronic kidney disease, stage 3a: Secondary | ICD-10-CM | POA: Insufficient documentation

## 2022-09-15 ENCOUNTER — Ambulatory Visit (INDEPENDENT_AMBULATORY_CARE_PROVIDER_SITE_OTHER): Payer: BC Managed Care – PPO | Admitting: Sports Medicine

## 2022-09-15 VITALS — BP 124/82 | Ht 73.0 in | Wt 215.0 lb

## 2022-09-15 DIAGNOSIS — M1711 Unilateral primary osteoarthritis, right knee: Secondary | ICD-10-CM | POA: Diagnosis not present

## 2022-09-15 LAB — CBC
Hematocrit: 45.2 % (ref 37.5–51.0)
Hemoglobin: 15.8 g/dL (ref 13.0–17.7)
MCH: 30.2 pg (ref 26.6–33.0)
MCHC: 35 g/dL (ref 31.5–35.7)
MCV: 86 fL (ref 79–97)
Platelets: 273 10*3/uL (ref 150–450)
RBC: 5.23 x10E6/uL (ref 4.14–5.80)
RDW: 11.7 % (ref 11.6–15.4)
WBC: 6.8 10*3/uL (ref 3.4–10.8)

## 2022-09-15 LAB — PSA: Prostate Specific Ag, Serum: 1.1 ng/mL (ref 0.0–4.0)

## 2022-09-15 LAB — COMPREHENSIVE METABOLIC PANEL
ALT: 24 IU/L (ref 0–44)
AST: 21 IU/L (ref 0–40)
Albumin/Globulin Ratio: 2 (ref 1.2–2.2)
Albumin: 4.4 g/dL (ref 3.9–4.9)
Alkaline Phosphatase: 131 IU/L — ABNORMAL HIGH (ref 44–121)
BUN/Creatinine Ratio: 14 (ref 10–24)
BUN: 24 mg/dL (ref 8–27)
Bilirubin Total: 0.7 mg/dL (ref 0.0–1.2)
CO2: 23 mmol/L (ref 20–29)
Calcium: 9.2 mg/dL (ref 8.6–10.2)
Chloride: 100 mmol/L (ref 96–106)
Creatinine, Ser: 1.71 mg/dL — ABNORMAL HIGH (ref 0.76–1.27)
Globulin, Total: 2.2 g/dL (ref 1.5–4.5)
Glucose: 97 mg/dL (ref 70–99)
Potassium: 4.3 mmol/L (ref 3.5–5.2)
Sodium: 136 mmol/L (ref 134–144)
Total Protein: 6.6 g/dL (ref 6.0–8.5)
eGFR: 45 mL/min/{1.73_m2} — ABNORMAL LOW (ref >59)

## 2022-09-15 LAB — VITAMIN B6: Vitamin B6: 34.4 ug/L (ref 3.4–65.2)

## 2022-09-15 LAB — LIPID PANEL
Chol/HDL Ratio: 3.8 ratio (ref 0.0–5.0)
Cholesterol, Total: 143 mg/dL (ref 100–199)
HDL: 38 mg/dL — ABNORMAL LOW (ref >39)
LDL Chol Calc (NIH): 87 mg/dL (ref 0–99)
Triglycerides: 97 mg/dL (ref 0–149)
VLDL Cholesterol Cal: 18 mg/dL (ref 5–40)

## 2022-09-15 LAB — HEMOGLOBIN A1C
Est. average glucose Bld gHb Est-mCnc: 123 mg/dL
Hgb A1c MFr Bld: 5.9 % — ABNORMAL HIGH (ref 4.8–5.6)

## 2022-09-15 LAB — B12 AND FOLATE PANEL
Folate: 18 ng/mL (ref >3.0)
Vitamin B-12: 611 pg/mL (ref 232–1245)

## 2022-09-15 LAB — VITAMIN B1: Thiamine: 154.4 nmol/L (ref 66.5–200.0)

## 2022-09-15 LAB — VITAMIN D 25 HYDROXY (VIT D DEFICIENCY, FRACTURES): Vit D, 25-Hydroxy: 36.2 ng/mL (ref 30.0–100.0)

## 2022-09-15 MED ORDER — METHYLPREDNISOLONE ACETATE 40 MG/ML IJ SUSP
40.0000 mg | Freq: Once | INTRAMUSCULAR | Status: AC
  Administered 2022-09-15: 40 mg via INTRA_ARTICULAR

## 2022-09-15 NOTE — Progress Notes (Signed)
  Trevor Boyd - 62 y.o. male MRN [id number]  Date of birth: May 04, 1961    CHIEF COMPLAINT:   Right knee pain    SUBJECTIVE:   HPI:  Pleasant 62 year old male with history of right knee osteoarthritis comes into clinic to follow-up right knee pain.  Was previously evaluated here in May and September 2023.  He had x-rays from June 2023 that showed mild patellofemoral osteoarthritis.  He previously had been treating this conservatively with home exercises, oral diclofenac/meloxicam and wearing a knee brace.  He previously done pretty well with that up until the last several months.  His knee is starting to ache more constantly over the anterior part of the knee. It is made worse with going up stairs.  He had to stop taking NSAIDs due to stage III kidney disease.  He sometimes will feel a click but does not report any issues with twisting movements or catching or locking in the knee.  He is now ready to try a corticosteroid injection.  ROS:     See HPI  PERTINENT  PMH / PSH FH / / SH:  Past Medical, Surgical, Social, and Family History Reviewed & Updated in the EMR.  Pertinent findings include:  CKD stage 3  OBJECTIVE: BP 124/82   Ht '6\' 1"'$  (1.854 m)   Wt 215 lb (97.5 kg)   BMI 28.37 kg/m   Physical Exam:  Vital signs are reviewed.  GEN: Alert and oriented, NAD Pulm: Breathing unlabored PSY: normal mood, congruent affect  MSK: Right knee -no obvious deformity.  No erythema.  No effusion.  Nontender to palpation medial lateral joint line.  Full range of motion in flexion and extension.  5/5 strength with resisted extension.  No ligamentous instability valgus varus stressing.  Negative McMurray test.  1+ Plus crepitus.  Neurovascularly intact distally  ASSESSMENT & PLAN:  1.  Right knee osteoarthritis  -Will proceed with the intra-articular corticosteroid injection today.  Tolerated procedure well.  He will follow-up as needed.  If he does not have good response to this, we discussed  that gel injections could also be a option later on down the road.  All questions were answered and he agrees to the plan.  Procedure performed:  Right Knee Intraarticular Corticosteroid Injection; palpation guided  Consent obtained and verified. Time-out conducted. Noted no overlying erythema, induration, or other signs of local infection. The right anterior lateral joint space was palpated and marked. The overlying skin was prepped in a sterile fashion. Topical analgesic spray: Ethyl chloride. Needle: 22 gauge, 1.5 inch Meds: 40 mg methylrprednisolone, 4 ml 1% lidocaine without epinephrine Completed without difficulty  Advised to call if fevers/chills, erythema, induration, drainage, or persistent bleeding.  Dortha Kern, MD PGY-4, Sports Medicine Fellow [facility]  Addendum:  I was the preceptor for this visit and available for immediate consultation.  Karlton Lemon MD Kirt Boys

## 2022-09-26 ENCOUNTER — Encounter: Payer: Self-pay | Admitting: Family Medicine

## 2022-09-27 ENCOUNTER — Other Ambulatory Visit: Payer: Self-pay | Admitting: Family Medicine

## 2022-09-27 DIAGNOSIS — N1831 Chronic kidney disease, stage 3a: Secondary | ICD-10-CM

## 2022-10-07 DIAGNOSIS — N179 Acute kidney failure, unspecified: Secondary | ICD-10-CM

## 2022-10-07 HISTORY — DX: Acute kidney failure, unspecified: N17.9

## 2022-10-12 DIAGNOSIS — N1831 Chronic kidney disease, stage 3a: Secondary | ICD-10-CM | POA: Diagnosis not present

## 2022-10-13 LAB — COMPREHENSIVE METABOLIC PANEL
ALT: 19 IU/L (ref 0–44)
AST: 21 IU/L (ref 0–40)
Albumin/Globulin Ratio: 1.7 (ref 1.2–2.2)
Albumin: 4.1 g/dL (ref 3.9–4.9)
Alkaline Phosphatase: 132 IU/L — ABNORMAL HIGH (ref 44–121)
BUN/Creatinine Ratio: 16 (ref 10–24)
BUN: 16 mg/dL (ref 8–27)
Bilirubin Total: 0.6 mg/dL (ref 0.0–1.2)
CO2: 21 mmol/L (ref 20–29)
Calcium: 9.2 mg/dL (ref 8.6–10.2)
Chloride: 101 mmol/L (ref 96–106)
Creatinine, Ser: 0.99 mg/dL (ref 0.76–1.27)
Globulin, Total: 2.4 g/dL (ref 1.5–4.5)
Glucose: 105 mg/dL — ABNORMAL HIGH (ref 70–99)
Potassium: 4.1 mmol/L (ref 3.5–5.2)
Sodium: 137 mmol/L (ref 134–144)
Total Protein: 6.5 g/dL (ref 6.0–8.5)
eGFR: 87 mL/min/{1.73_m2} (ref >59)

## 2022-10-13 NOTE — Progress Notes (Signed)
Trevor Boyd, You have successfully reversed your creatinine elevation as well as your decrease in kidney filtration, eGFR. Your labs are where they were 3 years ago. This is amazing work. Elevation remains in alkaline phosphatase- recommend diet low in saturated fat and regular exercise - 30 min at least 5 times per week

## 2022-11-15 ENCOUNTER — Encounter: Payer: Self-pay | Admitting: Sports Medicine

## 2022-11-16 ENCOUNTER — Other Ambulatory Visit: Payer: Self-pay | Admitting: Family Medicine

## 2022-11-16 DIAGNOSIS — F439 Reaction to severe stress, unspecified: Secondary | ICD-10-CM

## 2022-11-17 NOTE — Telephone Encounter (Signed)
Requested Prescriptions  Pending Prescriptions Disp Refills   sertraline (ZOLOFT) 100 MG tablet [Pharmacy Med Name: SERTRALINE '100MG'$  TABLETS] 90 tablet 1    Sig: TAKE 1 TABLET(100 MG) BY MOUTH DAILY     Psychiatry:  Antidepressants - SSRI - sertraline Passed - 11/16/2022  8:25 PM      Passed - AST in normal range and within 360 days    AST  Date Value Ref Range Status  10/12/2022 21 0 - 40 IU/L Final         Passed - ALT in normal range and within 360 days    ALT  Date Value Ref Range Status  10/12/2022 19 0 - 44 IU/L Final         Passed - Completed PHQ-2 or PHQ-9 in the last 360 days      Passed - Valid encounter within last 6 months    Recent Outpatient Visits           2 months ago Frequent urination at night   [facility] Tally Joe T, FNP   6 months ago Pain of left calf   [facility] Birdie Sons, MD   7 months ago Bee sting, accidental or unintentional, initial encounter   [place] Mikey Kirschner, PA-C   9 months ago Lateral knee pain, right   [facility] Gwyneth Sprout, [place]   1 year ago Annual physical exam   [place] Gwyneth Sprout, FNP

## 2022-12-15 ENCOUNTER — Ambulatory Visit (INDEPENDENT_AMBULATORY_CARE_PROVIDER_SITE_OTHER): Payer: BC Managed Care – PPO | Admitting: Sports Medicine

## 2022-12-15 VITALS — BP 128/86 | Ht 73.0 in | Wt 210.0 lb

## 2022-12-15 DIAGNOSIS — M1711 Unilateral primary osteoarthritis, right knee: Secondary | ICD-10-CM

## 2022-12-15 MED ORDER — METHYLPREDNISOLONE ACETATE 40 MG/ML IJ SUSP
40.0000 mg | Freq: Once | INTRAMUSCULAR | Status: AC
  Administered 2022-12-15: 40 mg via INTRA_ARTICULAR

## 2022-12-15 NOTE — Progress Notes (Unsigned)
  NOHA KRIVANEK - 62 y.o. male MRN [id number]  Date of birth: 29-Mar-1961    CHIEF COMPLAINT:   Right knee pain    SUBJECTIVE:   HPI:  Pleasant 62 year old male with known patellofemoral osteoarthritis of the right knee comes to clinic today for follow-up.  He last had a cortisone injection about 3 months ago.  This gave him about 2 months of pain relief.  He would like to repeat an injection of cortisone today and plan to start gel injections in the future.  He would like to prolong knee replacement for as long as possible.  Of note, he says his primary care doctor did blood work in between her last visit was noted of CKD.  He stopped take meloxicam and his primary care doctor repeated the blood work which resolved his kidney problems.  He will need to be judicious moving forward with NSAIDs.  ROS:     See HPI  PERTINENT  PMH / PSH FH / / SH:  Past Medical, Surgical, Social, and Family History Reviewed & Updated in the EMR.  Pertinent findings include:  none  OBJECTIVE: BP 128/86   Ht 6\' 1"  (1.854 m)   Wt 210 lb (95.3 kg)   BMI 27.71 kg/m   Physical Exam:  Vital signs are reviewed.  GEN: Alert and oriented, NAD Pulm: Breathing unlabored PSY: normal mood, congruent affect  MSK: Right knee -no obvious deformity.  No erythema.  No effusion.  Mildly tender palpation medial joint line.  Nontender palpation lateral joint line.  Range of motion is full.  5/5 strength throughout.  There is crepitus with flexion extension of the knee.  No ligamentous instability valgus varus stressing.  Negative Lachman.  Neurovascular intact distally.  ASSESSMENT & PLAN:  1.  Right knee OA  -Proceed with a repeat corticosteroid injection today.  Instructed him to call clinic sometime in the next 2 months or so to start getting authorization ready for gel injections and we can stagger his injection schedule with steroid and gel as needed.  He will stop meloxicam in the meantime due to his kidney issues as  noted above.  All questions answered and he agrees to the plan.  Procedure performed:  Right Knee Intraarticular Corticosteroid Injection; palpation guided  Consent obtained and verified. Time-out conducted. Noted no overlying erythema, induration, or other signs of local infection. The right anterior lateral joint space was palpated and marked. The overlying skin was prepped in a sterile fashion. Topical analgesic spray: Ethyl chloride. Needle: 25 gauge, 1.5 inch Meds: 40 mg methylrprednisolone, 4 ml 1% lidocaine without epinephrine Completed without difficulty  Advised to call if fevers/chills, erythema, induration, drainage, or persistent bleeding.   Arvella Nigh, MD PGY-4, Sports Medicine Fellow [facility]  Addendum:  I was the preceptor for this visit and available for immediate consultation.  Norton Blizzard MD Marrianne Mood

## 2022-12-21 ENCOUNTER — Encounter: Payer: Self-pay | Admitting: Family Medicine

## 2023-03-01 ENCOUNTER — Emergency Department: Payer: BC Managed Care – PPO

## 2023-03-01 ENCOUNTER — Emergency Department
Admission: EM | Admit: 2023-03-01 | Discharge: 2023-03-01 | Disposition: A | Discharge status: Home / Self Care | Payer: BC Managed Care – PPO | Attending: Emergency Medicine | Admitting: Emergency Medicine

## 2023-03-01 ENCOUNTER — Other Ambulatory Visit: Payer: Self-pay

## 2023-03-01 DIAGNOSIS — S46012A Strain of muscle(s) and tendon(s) of the rotator cuff of left shoulder, initial encounter: Secondary | ICD-10-CM | POA: Diagnosis not present

## 2023-03-01 DIAGNOSIS — W010XXA Fall on same level from slipping, tripping and stumbling without subsequent striking against object, initial encounter: Secondary | ICD-10-CM | POA: Diagnosis not present

## 2023-03-01 DIAGNOSIS — M79622 Pain in left upper arm: Secondary | ICD-10-CM | POA: Diagnosis not present

## 2023-03-01 DIAGNOSIS — M19012 Primary osteoarthritis, left shoulder: Secondary | ICD-10-CM | POA: Diagnosis not present

## 2023-03-01 DIAGNOSIS — S4992XA Unspecified injury of left shoulder and upper arm, initial encounter: Secondary | ICD-10-CM | POA: Diagnosis not present

## 2023-03-01 DIAGNOSIS — M75102 Unspecified rotator cuff tear or rupture of left shoulder, not specified as traumatic: Secondary | ICD-10-CM | POA: Diagnosis not present

## 2023-03-01 MED ORDER — HYDROCODONE-ACETAMINOPHEN 5-325 MG PO TABS: 1.0000 | ORAL_TABLET | Freq: Three times a day (TID) | ORAL | 0 refills | Status: AC | PRN

## 2023-03-01 MED ORDER — CYCLOBENZAPRINE HCL 5 MG PO TABS: 5.0000 mg | ORAL_TABLET | Freq: Three times a day (TID) | ORAL | 0 refills | Status: DC | PRN

## 2023-03-01 NOTE — Discharge Instructions (Signed)
Your x-rays are negative for any acute fracture or dislocation to the left shoulder joint.  Your exam however, is concerning for rotator cuff tear.  You have decreased active range of motion with raising the arm in front of you and out to the side.  You should take the prescription anti-inflammatory and muscle relaxant and/or pain medicine as directed.  Apply ice to the sore to help reduce any pain and swelling.  Come out of the sling to 3 times daily to perform simple pendulum shoulder exercises.  Follow-up with orthopedics for further evaluation and management.

## 2023-03-01 NOTE — ED Triage Notes (Addendum)
Pt to ED via POV c/o left arm injury after a mechanical that happened 45 mins ago. Pt was outside gardening when he tripped over a rock and fell on concrete on left side. Denies hitting head, denies taking blood thinners. Pt reports some tingling to fingers, radial pulse palpable. Denies CP, SOB, dizziness.

## 2023-03-01 NOTE — ED Provider Notes (Signed)
[facility] Emergency Department Provider Note     Event Date/Time   First MD Initiated Contact with Patient 03/01/23 2008     (approximate)   History   Arm Injury   HPI  Trevor Boyd is a 62 y.o. right-hand-dominant male presents to the ED with acute left shoulder pain and disability.  Patient reports a mechanical fall after about 4560 minutes prior to arrival.  He was outside in the garden, when tripped over a rug landing on his adducted left arm and shoulder.  Denies any head injury or LOC.  Patient also denies any blood thinner use.  He reports some now resolved tingling to his fingers.  He admits to decrease ability to raise the left arm at the shoulder.  No history of chronic or ongoing shoulder problems.  Physical Exam   Triage Vital Signs: ED Triage Vitals  Enc Vitals Group     BP 03/01/23 1951 (!) 145/104     Pulse Rate 03/01/23 1951 74     Resp 03/01/23 1951 18     Temp 03/01/23 1951 98.1 F (36.7 C)     Temp Source 03/01/23 1951 Oral     SpO2 03/01/23 1951 96 %     Weight 03/01/23 1949 210 lb (95.3 kg)     Height 03/01/23 1949 6\' 1"  (1.854 m)     Head Circumference --      Peak Flow --      Pain Score 03/01/23 1949 10     Pain Loc --      Pain Edu? --      Excl. in GC? --     Most recent vital signs: Vitals:   03/01/23 1951  BP: (!) 145/104  Pulse: 74  Resp: 18  Temp: 98.1 F (36.7 C)  SpO2: 96%    General Awake, no distress. NAD HEENT NCAT. PERRL. EOMI. No rhinorrhea. Mucous membranes are moist.  CV:  Good peripheral perfusion.  RESP:  Normal effort.  ABD:  No distention.  MSK:  Left shoulder without obvious deformity, dislocation, or sulcus sign.  Tenderness to palpation over the caudal shoulder and posterior shoulder joint.  Normal active range of motion distally at the elbow and wrist.  Normal (rotation range noted.  No bicep muscle deformity appreciated.  Decreased active range of motion with attempts to extend or  abduct the shoulder.  Positive empty can sign.   ED Results / Procedures / Treatments   Labs (all labs ordered are listed, but only abnormal results are displayed) Labs Reviewed - No data to display   EKG   RADIOLOGY  I personally viewed and evaluated these images as part of my medical decision making, as well as reviewing the written report by the radiologist.  ED Provider Interpretation: No acute bony injury noted.  DG Shoulder Left  Result Date: 03/01/2023 CLINICAL DATA:  Left arm injury after mechanical fall. EXAM: LEFT SHOULDER - 2+ VIEW COMPARISON:  Left humerus radiographs earlier same day FINDINGS: Normal glenohumeral and acromioclavicular alignment. Mild peripheral clavicular and minimal inferior glenoid degenerative osteophytosis. No acute fracture or dislocation. Mildly decreased lung volumes with mild left basilar bronchovascular crowding. 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 No acute fracture. Electronically Signed   By: Neita Garnet M.D.   On: 03/01/2023 21:30   DG Humerus Left  Result Date: 03/01/2023 CLINICAL DATA:  Left arm injury after mechanical fall onto concrete. Biceps region pain. Tingling in fingertips. EXAM: LEFT HUMERUS -  2+ VIEW COMPARISON:  None Available. FINDINGS: Normal bone mineralization. No acute fracture is seen within the humerus. Normal alignment of the acromioclavicular, glenohumeral, and elbow joints. IMPRESSION: No acute fracture. Electronically Signed   By: Neita Garnet M.D.   On: 03/01/2023 20:19     PROCEDURES:  Critical Care performed: No  Procedures   MEDICATIONS ORDERED IN ED: Medications - No data to display   IMPRESSION / MDM / ASSESSMENT AND PLAN / ED COURSE  I reviewed the triage vital signs and the nursing notes.                              Differential diagnosis includes, but is not limited to, shoulder fracture, shoulder dislocation, humeral fracture, bursitis, tendinitis,  rotator cuff tear  Patient's presentation is most consistent with acute complicated illness / injury requiring diagnostic workup.  Patient's diagnosis is consistent with likely rotator cuff tear.  No radiologic evidence of any acute fracture or dislocation to the shoulder or humerus based on interpretation of images.  Patient with decreased active extension and abduction range the left shoulder, consistent with a likely complete tear of the supraspinatus.  Patient will be discharged home with prescriptions for hydrocodone and Flexeril. Patient is to follow up with Dr. Edmund Hilda G as needed or otherwise directed. Patient is given ED precautions to return to the ED for any worsening or new symptoms.     FINAL CLINICAL IMPRESSION(S) / ED DIAGNOSES   Final diagnoses:  Traumatic tear of left rotator cuff, unspecified tear extent, initial encounter     Rx / DC Orders   ED Discharge Orders          Ordered    cyclobenzaprine (FLEXERIL) 5 MG tablet  3 times daily PRN        03/01/23 2156    HYDROcodone-acetaminophen (NORCO) 5-325 MG tablet  3 times daily PRN        03/01/23 2156             Note:  This document was prepared using Dragon voice recognition software and may include unintentional dictation errors.    Lissa Hoard, PA-C 03/01/23 2354    Dionne Bucy, MD 03/02/23 1113

## 2023-03-03 DIAGNOSIS — S46002A Unspecified injury of muscle(s) and tendon(s) of the rotator cuff of left shoulder, initial encounter: Secondary | ICD-10-CM | POA: Diagnosis not present

## 2023-03-03 DIAGNOSIS — M25312 Other instability, left shoulder: Secondary | ICD-10-CM | POA: Diagnosis not present

## 2023-03-04 ENCOUNTER — Encounter: Payer: Self-pay | Admitting: Orthopedic Surgery

## 2023-03-04 ENCOUNTER — Other Ambulatory Visit: Payer: Self-pay | Admitting: Orthopedic Surgery

## 2023-03-04 DIAGNOSIS — S46002A Unspecified injury of muscle(s) and tendon(s) of the rotator cuff of left shoulder, initial encounter: Secondary | ICD-10-CM

## 2023-03-11 ENCOUNTER — Ambulatory Visit
Admission: RE | Admit: 2023-03-11 | Discharge: 2023-03-11 | Disposition: A | Discharge status: Home / Self Care | Payer: BC Managed Care – PPO | Source: Ambulatory Visit | Attending: Orthopedic Surgery | Admitting: Orthopedic Surgery

## 2023-03-11 DIAGNOSIS — M19012 Primary osteoarthritis, left shoulder: Secondary | ICD-10-CM | POA: Diagnosis not present

## 2023-03-11 DIAGNOSIS — M25512 Pain in left shoulder: Secondary | ICD-10-CM | POA: Diagnosis not present

## 2023-03-11 DIAGNOSIS — S46002A Unspecified injury of muscle(s) and tendon(s) of the rotator cuff of left shoulder, initial encounter: Secondary | ICD-10-CM

## 2023-03-11 DIAGNOSIS — S46012A Strain of muscle(s) and tendon(s) of the rotator cuff of left shoulder, initial encounter: Secondary | ICD-10-CM | POA: Diagnosis not present

## 2023-03-19 ENCOUNTER — Other Ambulatory Visit: Payer: BC Managed Care – PPO

## 2023-03-24 DIAGNOSIS — S46012A Strain of muscle(s) and tendon(s) of the rotator cuff of left shoulder, initial encounter: Secondary | ICD-10-CM | POA: Diagnosis not present

## 2023-03-29 ENCOUNTER — Other Ambulatory Visit: Payer: Self-pay | Admitting: Orthopedic Surgery

## 2023-03-31 ENCOUNTER — Encounter
Admission: RE | Admit: 2023-03-31 | Discharge: 2023-03-31 | Disposition: A | Discharge status: Home / Self Care | Payer: BC Managed Care – PPO | Source: Ambulatory Visit | Attending: Orthopedic Surgery | Admitting: Orthopedic Surgery

## 2023-03-31 HISTORY — DX: Prediabetes: R73.03

## 2023-03-31 HISTORY — DX: Elevated blood-pressure reading, without diagnosis of hypertension: R03.0

## 2023-03-31 HISTORY — DX: Abnormal levels of other serum enzymes: R74.8

## 2023-03-31 NOTE — Patient Instructions (Signed)
Your procedure is scheduled on:04-05-23 Tuesday Report to the Registration Desk on the 1st floor of the [facility].Then proceed to the 2nd floor Surgery Desk To find out your arrival time, please call [phone] between 1PM - 3PM on:04-04-23 Monday If your arrival time is 6:00 am, do not arrive before that time as the [facility] Mall entrance doors do not open until 6:00 am.  REMEMBER: Instructions that are not followed completely may result in serious medical risk, up to and including death; or upon the discretion of your surgeon and anesthesiologist your surgery may need to be rescheduled.  Do not eat food after midnight the night before surgery.  No gum chewing or hard candies.  You may however, drink CLEAR liquids up to 2 hours before you are scheduled to arrive for your surgery. Do not drink anything within 2 hours of your scheduled arrival time.  Clear liquids include: - water  - apple juice without pulp - gatorade (not RED colors) - black coffee or tea (Do NOT add milk or creamers to the coffee or tea) Do NOT drink anything that is not on this list.  In addition, your doctor has ordered for you to drink the provided:  Ensure Pre-Surgery Clear Carbohydrate Drink  Drinking this carbohydrate drink up to two hours before surgery helps to reduce insulin resistance and improve patient outcomes. Please complete drinking 2 hours before scheduled arrival time.  One week prior to surgery: Stop Anti-inflammatories (NSAIDS) such as Advil, Aleve, Ibuprofen, Motrin, Naproxen, Naprosyn and Aspirin based products such as Excedrin, Goody's Powder, BC Powder.You may however,  take Tylenol if needed for pain up until the day of surgery. Stop ANY OVER THE COUNTER supplements/vitamins NOW (03-31-23) until after surgery (Multivitamin)   Continue taking all prescribed medications   TAKE ONLY THESE MEDICATIONS THE MORNING OF SURGERY WITH A SIP OF WATER: -sertraline (ZOLOFT)   No Alcohol for 24 hours  before or after surgery.  No Smoking including e-cigarettes for 24 hours before surgery.  No chewable tobacco products for at least 6 hours before surgery.  No nicotine patches on the day of surgery.  Do not use any "recreational" drugs for at least a week (preferably 2 weeks) before your surgery.  Please be advised that the combination of cocaine and anesthesia may have negative outcomes, up to and including death. If you test positive for cocaine, your surgery will be cancelled.  On the morning of surgery brush your teeth with toothpaste and water, you may rinse your mouth with mouthwash if you wish. Do not swallow any toothpaste or mouthwash.  Use CHG Soap as directed on instruction sheet.  Do not wear jewelry, make-up, hairpins, clips or nail polish.  Do not wear lotions, powders, or perfumes.   Do not shave body hair from the neck down 48 hours before surgery.  Contact lenses, hearing aids and dentures may not be worn into surgery.  Do not bring valuables to the hospital. [facility] is not responsible for any missing/lost belongings or valuables.   Notify your doctor if there is any change in your medical condition (cold, fever, infection).  Wear comfortable clothing (specific to your surgery type) to the hospital.  After surgery, you can help prevent lung complications by doing breathing exercises.  Take deep breaths and cough every 1-2 hours. Your doctor may order a device called an Incentive Spirometer to help you take deep breaths. When coughing or sneezing, hold a pillow firmly against your incision with both hands.  This is called "splinting." Doing this helps protect your incision. It also decreases belly discomfort.  If you are being admitted to the hospital overnight, leave your suitcase in the car. After surgery it may be brought to your room.  In case of increased patient census, it may be necessary for you, the patient, to continue your postoperative care in the  Same Day Surgery department.  If you are being discharged the day of surgery, you will not be allowed to drive home. You will need a responsible individual to drive you home and stay with you for 24 hours after surgery.   If you are taking public transportation, you will need to have a responsible individual with you.  Please call the Pre-admissions Testing Dept. at [phone] if you have any questions about these instructions.  Surgery Visitation Policy:  Patients having surgery or a procedure may have two visitors.  Children under the age of 40 must have an adult with them who is not the patient.     Preparing for Surgery with CHLORHEXIDINE GLUCONATE (CHG) Soap  Chlorhexidine Gluconate (CHG) Soap  o An antiseptic cleaner that kills germs and bonds with the skin to continue killing germs even after washing  o Used for showering the night before surgery and morning of surgery  Before surgery, you can play an important role by reducing the number of germs on your skin.  CHG (Chlorhexidine gluconate) soap is an antiseptic cleanser which kills germs and bonds with the skin to continue killing germs even after washing.  Please do not use if you have an allergy to CHG or antibacterial soaps. If your skin becomes reddened/irritated stop using the CHG.  1. Shower the NIGHT BEFORE SURGERY and the MORNING OF SURGERY with CHG soap.  2. If you choose to wash your hair, wash your hair first as usual with your normal shampoo.  3. After shampooing, rinse your hair and body thoroughly to remove the shampoo.  4. Use CHG as you would any other liquid soap. You can apply CHG directly to the skin and wash gently with a scrungie or a clean washcloth.  5. Apply the CHG soap to your body only from the neck down. Do not use on open wounds or open sores. Avoid contact with your eyes, ears, mouth, and genitals (private parts). Wash face and genitals (private parts) with your normal soap.  6. Wash  thoroughly, paying special attention to the area where your surgery will be performed.  7. Thoroughly rinse your body with warm water.  8. Do not shower/wash with your normal soap after using and rinsing off the CHG soap.  9. Pat yourself dry with a clean towel.  10. Wear clean pajamas to bed the night before surgery.  12. Place clean sheets on your bed the night of your first shower and do not sleep with pets.  13. Shower again with the CHG soap on the day of surgery prior to arriving at the hospital.  14. Do not apply any deodorants/lotions/powders.  15. Please wear clean clothes to the hospital.

## 2023-04-04 MED ORDER — CHLORHEXIDINE GLUCONATE 0.12 % MT SOLN
15.0000 mL | Freq: Once | OROMUCOSAL | Status: AC
  Administered 2023-04-05: 15 mL via OROMUCOSAL

## 2023-04-04 MED ORDER — ORAL CARE MOUTH RINSE: 15.0000 mL | Freq: Once | OROMUCOSAL | Status: AC

## 2023-04-04 MED ORDER — LACTATED RINGERS IV SOLN: INTRAVENOUS | Status: DC

## 2023-04-04 MED ORDER — FAMOTIDINE 20 MG PO TABS
20.0000 mg | ORAL_TABLET | Freq: Once | ORAL | Status: AC
  Administered 2023-04-05: 20 mg via ORAL

## 2023-04-04 MED ORDER — CEFAZOLIN SODIUM-DEXTROSE 2-4 GM/100ML-% IV SOLN
2.0000 g | INTRAVENOUS | Status: AC
  Administered 2023-04-05 (×2): 2 g via INTRAVENOUS

## 2023-04-05 ENCOUNTER — Ambulatory Visit: Payer: BC Managed Care – PPO | Admitting: General Practice

## 2023-04-05 ENCOUNTER — Ambulatory Visit: Payer: BC Managed Care – PPO

## 2023-04-05 ENCOUNTER — Other Ambulatory Visit: Payer: Self-pay

## 2023-04-05 ENCOUNTER — Encounter
Admission: RE | Disposition: A | Discharge status: Home / Self Care | Payer: Self-pay | Source: Home / Self Care | Attending: Orthopedic Surgery

## 2023-04-05 ENCOUNTER — Ambulatory Visit
Admission: RE | Admit: 2023-04-05 | Discharge: 2023-04-05 | Disposition: A | Discharge status: Home / Self Care | Payer: BC Managed Care – PPO | Attending: Orthopedic Surgery | Admitting: Orthopedic Surgery

## 2023-04-05 ENCOUNTER — Encounter: Payer: Self-pay | Admitting: Orthopedic Surgery

## 2023-04-05 DIAGNOSIS — M7542 Impingement syndrome of left shoulder: Secondary | ICD-10-CM | POA: Diagnosis not present

## 2023-04-05 DIAGNOSIS — M7522 Bicipital tendinitis, left shoulder: Secondary | ICD-10-CM | POA: Diagnosis not present

## 2023-04-05 DIAGNOSIS — M25812 Other specified joint disorders, left shoulder: Secondary | ICD-10-CM | POA: Diagnosis not present

## 2023-04-05 DIAGNOSIS — F32A Depression, unspecified: Secondary | ICD-10-CM | POA: Diagnosis not present

## 2023-04-05 DIAGNOSIS — M7582 Other shoulder lesions, left shoulder: Secondary | ICD-10-CM | POA: Diagnosis not present

## 2023-04-05 DIAGNOSIS — M19012 Primary osteoarthritis, left shoulder: Secondary | ICD-10-CM | POA: Insufficient documentation

## 2023-04-05 DIAGNOSIS — S46012A Strain of muscle(s) and tendon(s) of the rotator cuff of left shoulder, initial encounter: Secondary | ICD-10-CM | POA: Insufficient documentation

## 2023-04-05 DIAGNOSIS — W010XXA Fall on same level from slipping, tripping and stumbling without subsequent striking against object, initial encounter: Secondary | ICD-10-CM | POA: Diagnosis not present

## 2023-04-05 DIAGNOSIS — G8918 Other acute postprocedural pain: Secondary | ICD-10-CM | POA: Diagnosis not present

## 2023-04-05 HISTORY — PX: SHOULDER ARTHROSCOPY WITH SUBACROMIAL DECOMPRESSION AND OPEN ROTATOR C: SHX5688

## 2023-04-05 SURGERY — SHOULDER ARTHROSCOPY WITH SUBACROMIAL DECOMPRESSION AND OPEN ROTATOR CUFF REPAIR, OPEN BICEPS TENDON REPAIR
Anesthesia: General | Site: Shoulder | Laterality: Left

## 2023-04-05 MED ORDER — DEXAMETHASONE SODIUM PHOSPHATE 10 MG/ML IJ SOLN
INTRAMUSCULAR | Status: DC | PRN
  Administered 2023-04-05: 10 mg via INTRAVENOUS

## 2023-04-05 MED ORDER — ONDANSETRON HCL 4 MG/2ML IJ SOLN
INTRAMUSCULAR | Status: DC | PRN
  Administered 2023-04-05 (×2): 4 mg via INTRAVENOUS

## 2023-04-05 MED ORDER — PHENYLEPHRINE HCL-NACL 20-0.9 MG/250ML-% IV SOLN
INTRAVENOUS | Status: AC
  Filled 2023-04-05: qty 250

## 2023-04-05 MED ORDER — MIDAZOLAM HCL 2 MG/2ML IJ SOLN
1.0000 mg | Freq: Once | INTRAMUSCULAR | Status: AC
  Administered 2023-04-05: 1 mg via INTRAVENOUS

## 2023-04-05 MED ORDER — PHENYLEPHRINE HCL-NACL 20-0.9 MG/250ML-% IV SOLN
INTRAVENOUS | Status: DC | PRN
  Administered 2023-04-05: 25 ug/min via INTRAVENOUS

## 2023-04-05 MED ORDER — LACTATED RINGERS IR SOLN
Status: DC | PRN
  Administered 2023-04-05 (×4): 3001 mL

## 2023-04-05 MED ORDER — BUPIVACAINE HCL (PF) 0.5 % IJ SOLN
INTRAMUSCULAR | Status: AC
  Filled 2023-04-05: qty 30

## 2023-04-05 MED ORDER — ACETAMINOPHEN 10 MG/ML IV SOLN
INTRAVENOUS | Status: DC | PRN
  Administered 2023-04-05: 1000 mg via INTRAVENOUS

## 2023-04-05 MED ORDER — FENTANYL CITRATE PF 50 MCG/ML IJ SOSY
50.0000 ug | PREFILLED_SYRINGE | Freq: Once | INTRAMUSCULAR | Status: AC
  Administered 2023-04-05: 50 ug via INTRAVENOUS

## 2023-04-05 MED ORDER — ACETAMINOPHEN 10 MG/ML IV SOLN
INTRAVENOUS | Status: AC
  Filled 2023-04-05: qty 100

## 2023-04-05 MED ORDER — RINGERS IRRIGATION IR SOLN
Status: DC | PRN
  Administered 2023-04-05 (×18): 3000 mL

## 2023-04-05 MED ORDER — BUPIVACAINE LIPOSOME 1.3 % IJ SUSP
INTRAMUSCULAR | Status: DC | PRN
Start: 2023-04-05 — End: 2023-04-05
  Administered 2023-04-05: 20 mL

## 2023-04-05 MED ORDER — OXYCODONE HCL 5 MG/5ML PO SOLN: 5.0000 mg | Freq: Once | ORAL | Status: AC | PRN

## 2023-04-05 MED ORDER — EPHEDRINE SULFATE (PRESSORS) 50 MG/ML IJ SOLN
INTRAMUSCULAR | Status: DC | PRN
  Administered 2023-04-05: 5 mg via INTRAVENOUS

## 2023-04-05 MED ORDER — OXYCODONE HCL 5 MG PO TABS
ORAL_TABLET | ORAL | Status: AC
  Filled 2023-04-05: qty 1

## 2023-04-05 MED ORDER — PROPOFOL 10 MG/ML IV BOLUS
INTRAVENOUS | Status: DC | PRN
  Administered 2023-04-05: 200 mg via INTRAVENOUS

## 2023-04-05 MED ORDER — PHENYLEPHRINE 80 MCG/ML (10ML) SYRINGE FOR IV PUSH (FOR BLOOD PRESSURE SUPPORT)
PREFILLED_SYRINGE | INTRAVENOUS | Status: DC | PRN
  Administered 2023-04-05: 160 ug via INTRAVENOUS

## 2023-04-05 MED ORDER — BUPIVACAINE HCL (PF) 0.5 % IJ SOLN
INTRAMUSCULAR | Status: DC | PRN
Start: 2023-04-05 — End: 2023-04-05
  Administered 2023-04-05: 10 mL

## 2023-04-05 MED ORDER — CEFAZOLIN SODIUM-DEXTROSE 2-4 GM/100ML-% IV SOLN
INTRAVENOUS | Status: AC
  Filled 2023-04-05: qty 100

## 2023-04-05 MED ORDER — FENTANYL CITRATE PF 50 MCG/ML IJ SOSY
PREFILLED_SYRINGE | INTRAMUSCULAR | Status: AC
  Filled 2023-04-05: qty 1

## 2023-04-05 MED ORDER — OXYCODONE HCL 5 MG PO TABS
5.0000 mg | ORAL_TABLET | Freq: Once | ORAL | Status: AC | PRN
  Administered 2023-04-05: 5 mg via ORAL

## 2023-04-05 MED ORDER — FENTANYL CITRATE (PF) 100 MCG/2ML IJ SOLN
INTRAMUSCULAR | Status: DC | PRN
  Administered 2023-04-05: 50 ug via INTRAVENOUS

## 2023-04-05 MED ORDER — BUPIVACAINE LIPOSOME 1.3 % IJ SUSP
INTRAMUSCULAR | Status: AC
  Filled 2023-04-05: qty 20

## 2023-04-05 MED ORDER — EPINEPHRINE PF 1 MG/ML IJ SOLN
INTRAMUSCULAR | Status: AC
  Filled 2023-04-05: qty 4

## 2023-04-05 MED ORDER — MIDAZOLAM HCL 2 MG/2ML IJ SOLN
INTRAMUSCULAR | Status: AC
  Filled 2023-04-05: qty 2

## 2023-04-05 MED ORDER — FENTANYL CITRATE (PF) 100 MCG/2ML IJ SOLN: 25.0000 ug | INTRAMUSCULAR | Status: DC | PRN

## 2023-04-05 MED ORDER — ASPIRIN 325 MG PO TBEC: 325.0000 mg | DELAYED_RELEASE_TABLET | Freq: Every day | ORAL | 0 refills | Status: AC

## 2023-04-05 MED ORDER — BUPIVACAINE HCL (PF) 0.5 % IJ SOLN
INTRAMUSCULAR | Status: AC
  Filled 2023-04-05: qty 10

## 2023-04-05 MED ORDER — OXYCODONE HCL 5 MG PO TABS: 5.0000 mg | ORAL_TABLET | ORAL | 0 refills | Status: DC | PRN

## 2023-04-05 MED ORDER — ONDANSETRON 4 MG PO TBDP: 4.0000 mg | ORAL_TABLET | Freq: Three times a day (TID) | ORAL | 0 refills | Status: DC | PRN

## 2023-04-05 MED ORDER — FAMOTIDINE 20 MG PO TABS
ORAL_TABLET | ORAL | Status: AC
  Filled 2023-04-05: qty 1

## 2023-04-05 MED ORDER — CHLORHEXIDINE GLUCONATE 0.12 % MT SOLN
OROMUCOSAL | Status: AC
  Filled 2023-04-05: qty 15

## 2023-04-05 MED ORDER — ACETAMINOPHEN 500 MG PO TABS: 1000.0000 mg | ORAL_TABLET | Freq: Three times a day (TID) | ORAL | 2 refills | Status: DC

## 2023-04-05 MED ORDER — FENTANYL CITRATE (PF) 100 MCG/2ML IJ SOLN
INTRAMUSCULAR | Status: AC
  Filled 2023-04-05: qty 2

## 2023-04-05 MED ORDER — SUGAMMADEX SODIUM 200 MG/2ML IV SOLN
INTRAVENOUS | Status: DC | PRN
  Administered 2023-04-05: 200 mg via INTRAVENOUS

## 2023-04-05 MED ORDER — ROCURONIUM BROMIDE 100 MG/10ML IV SOLN
INTRAVENOUS | Status: DC | PRN
  Administered 2023-04-05: 50 mg via INTRAVENOUS
  Administered 2023-04-05: 20 mg via INTRAVENOUS
  Administered 2023-04-05: 30 mg via INTRAVENOUS

## 2023-04-05 MED ORDER — LIDOCAINE HCL (CARDIAC) PF 100 MG/5ML IV SOSY
PREFILLED_SYRINGE | INTRAVENOUS | Status: DC | PRN
  Administered 2023-04-05: 20 mg via INTRAVENOUS

## 2023-04-05 SURGICAL SUPPLY — 89 items
ADAPTER IRRIG TUBE 2 SPIKE SOL (ADAPTER) ×2 IMPLANT
ADH SKN CLS APL DERMABOND .7 (GAUZE/BANDAGES/DRESSINGS)
ADPR TBG 2 SPK PMP STRL ASCP (ADAPTER) ×2
ANCH SUT 1.4 SUT TPE BLK/WHT (SUTURE) ×1
ANCH SUT 2 SWLK 19.1 CLS EYLT (Anchor) ×2 IMPLANT
ANCH SUT 2.9 PUSHLOCK ANCH (Orthopedic Implant) ×1 IMPLANT
ANCH SUT 2X2.3 TAPE (Anchor) ×2 IMPLANT
ANCH SUT FBRTAPE 1.3X2.6X1.7 (Anchor) ×1 IMPLANT
ANCH SUT SHRT 12.5 CANN EYLT (Anchor) ×1 IMPLANT
ANCHOR 2.3 SP SGL 1.2 XBRAID (Anchor) IMPLANT
ANCHOR SUT BIOCOMP LK 2.9X12.5 (Anchor) IMPLANT
ANCHOR SUT FBRTK 2.6 SOFT 1.7 (Anchor) IMPLANT
ANCHOR SWIVELOCK BIO 4.75X19.1 (Anchor) ×1 IMPLANT
APL PRP STRL LF DISP 70% ISPRP (MISCELLANEOUS) ×1
BLADE SHAVER 4.5X7 STR FR (MISCELLANEOUS) ×1 IMPLANT
BNDG ADH 2 X3.75 FABRIC TAN LF (GAUZE/BANDAGES/DRESSINGS) ×1 IMPLANT
BNDG ADH XL 3.75X2 STRCH LF (GAUZE/BANDAGES/DRESSINGS) ×1
BUR BR 5.5 WIDE MOUTH (BURR) IMPLANT
CANNULA PART THRD DISP 5.75X7 (CANNULA) IMPLANT
CANNULA PARTIAL THREAD 2X7 (CANNULA) IMPLANT
CANNULA TWIST IN 8.25X7CM (CANNULA) IMPLANT
CANNULA TWIST IN 8.25X9CM (CANNULA) IMPLANT
CHLORAPREP W/TINT 26 (MISCELLANEOUS) ×1 IMPLANT
COOLER POLAR GLACIER W/PUMP (MISCELLANEOUS) ×1 IMPLANT
DERMABOND ADVANCED .7 DNX12 (GAUZE/BANDAGES/DRESSINGS) IMPLANT
DEVICE SUCT BLK HOLE OR FLOOR (MISCELLANEOUS) ×1 IMPLANT
DRAPE 3/4 80X56 (DRAPES) ×1 IMPLANT
DRAPE INCISE IOBAN 66X45 STRL (DRAPES) ×1 IMPLANT
DRAPE SURG IRRIG POUCH 19X23 (DRAPES) IMPLANT
DRAPE U-SHAPE 47X51 STRL (DRAPES) ×2 IMPLANT
DRSG TEGADERM 4X4.75 (GAUZE/BANDAGES/DRESSINGS) ×3 IMPLANT
ELECT REM PT RETURN 9FT ADLT (ELECTROSURGICAL) ×1
ELECTRODE REM PT RTRN 9FT ADLT (ELECTROSURGICAL) ×1 IMPLANT
GAUZE SPONGE 4X4 12PLY STRL (GAUZE/BANDAGES/DRESSINGS) ×1 IMPLANT
GAUZE XEROFORM 1X8 LF (GAUZE/BANDAGES/DRESSINGS) ×1 IMPLANT
GLOVE BIO SURGEON STRL SZ7.5 (GLOVE) ×1 IMPLANT
GLOVE BIOGEL PI IND STRL 8 (GLOVE) ×2 IMPLANT
GLOVE SURG ORTHO 8.0 STRL STRW (GLOVE) ×1 IMPLANT
GLOVE SURG SYN 8.0 (GLOVE) ×1 IMPLANT
GLOVE SURG SYN 8.0 PF PI (GLOVE) ×1 IMPLANT
GOWN STRL REUS W/ TWL LRG LVL3 (GOWN DISPOSABLE) ×2 IMPLANT
GOWN STRL REUS W/TWL LRG LVL3 (GOWN DISPOSABLE) ×2
GOWN STRL REUS W/TWL XL LVL4 (GOWN DISPOSABLE) ×1 IMPLANT
IV LACTATED RINGER IRRG 3000ML (IV SOLUTION) ×22
IV LR IRRIG 3000ML ARTHROMATIC (IV SOLUTION) ×4 IMPLANT
KIT CORKSCREW KNTLS 3.9 S/T/P (INSTRUMENTS) IMPLANT
KIT STABILIZATION SHOULDER (MISCELLANEOUS) ×1 IMPLANT
KIT SUTURETAK 3.0 INSERT PERC (KITS) IMPLANT
KIT TURNOVER KIT A (KITS) ×1 IMPLANT
MANIFOLD NEPTUNE II (INSTRUMENTS) ×2 IMPLANT
MASK FACE SPIDER DISP (MASK) ×1 IMPLANT
MAT ABSORB FLUID 56X50 GRAY (MISCELLANEOUS) ×2 IMPLANT
NDL MAYO CATGUT SZ5 (NEEDLE)
NDL SAFETY ECLIP 18X1.5 (MISCELLANEOUS) ×1 IMPLANT
NDL SCORPION MULTI FIRE (NEEDLE) IMPLANT
NDL SUT 5 .5 CRC TPR PNT MAYO (NEEDLE) IMPLANT
NEEDLE SCORPION MULTI FIRE (NEEDLE) ×1 IMPLANT
PACK ARTHROSCOPY SHOULDER (MISCELLANEOUS) ×1 IMPLANT
PAD ABD DERMACEA PRESS 5X9 (GAUZE/BANDAGES/DRESSINGS) IMPLANT
PAD ARMBOARD 7.5X6 YLW CONV (MISCELLANEOUS) ×1 IMPLANT
PAD WRAPON POLAR SHDR XLG (MISCELLANEOUS) ×1 IMPLANT
PASSER SUT FIRSTPASS SELF (INSTRUMENTS) ×1 IMPLANT
PASSER SUT SWIFTSTITCH HIP CRT (INSTRUMENTS) ×1 IMPLANT
SHAVER BLADE BONE CUTTER 5.5 (BLADE) ×1 IMPLANT
SLEEVE REMOTE CONTROL 5X12 (DRAPES) IMPLANT
SLING ULTRA II M (MISCELLANEOUS) ×1 IMPLANT
SPONGE T-LAP 18X18 ~~LOC~~+RFID (SPONGE) ×1 IMPLANT
STAPLER SKIN PROX 35W (STAPLE) IMPLANT
STRAP SAFETY 5IN WIDE (MISCELLANEOUS) ×1 IMPLANT
SUT ETHILON 3-0 (SUTURE) ×1 IMPLANT
SUT LASSO 90 DEG CVD (SUTURE) IMPLANT
SUT LASSO 90 DEG SD STR (SUTURE) IMPLANT
SUT MNCRL 4-0 (SUTURE) ×1
SUT MNCRL 4-0 27XMFL (SUTURE) ×1
SUT PROLENE 0 CT 2 (SUTURE) IMPLANT
SUT VIC AB 0 CT1 36 (SUTURE) IMPLANT
SUT VIC AB 2-0 CT2 27 (SUTURE) IMPLANT
SUT XBRAID 1.4 BLK/WHT (SUTURE) IMPLANT
SUTURE MNCRL 4-0 27XMF (SUTURE) IMPLANT
SYSTEM IMPL TENODESIS LNT 2.9 (Orthopedic Implant) IMPLANT
TAPE CLOTH 3X10 WHT NS LF (GAUZE/BANDAGES/DRESSINGS) ×1 IMPLANT
TAPE MICROFOAM 4IN (TAPE) ×1 IMPLANT
TRAP FLUID SMOKE EVACUATOR (MISCELLANEOUS) ×1 IMPLANT
TUBE SET DOUBLEFLO INFLOW (TUBING) ×1 IMPLANT
TUBE SET DOUBLEFLO OUTFLOW (TUBING) ×1 IMPLANT
TUBING CONNECTING 10 (TUBING) IMPLANT
WAND WEREWOLF FLOW 90D (MISCELLANEOUS) ×1 IMPLANT
WATER STERILE IRR 500ML POUR (IV SOLUTION) ×1 IMPLANT
WRAPON POLAR PAD SHDR XLG (MISCELLANEOUS) ×1

## 2023-04-05 NOTE — H&P (Signed)
Paper H&P to be scanned into permanent record. H&P reviewed. No significant changes noted.  

## 2023-04-05 NOTE — Discharge Instructions (Addendum)
Post-Op Instructions - Rotator Cuff Repair  1. Bracing: You will wear a shoulder immobilizer or sling for 6 weeks.   2. Driving: No driving for 3 weeks post-op. When driving, do not wear the immobilizer. Ideally, we recommend no driving for 6 weeks while sling is in place as one arm will be immobilized.   3. Activity: No active lifting for 2 months. Wrist, hand, and elbow motion only. Avoid lifting the upper arm away from the body except for hygiene. You are permitted to bend and straighten the elbow passively only (no active elbow motion). You may use your hand and wrist for typing, writing, and managing utensils (cutting food). Do not lift more than a coffee cup for 8 weeks.  When sleeping or resting, inclined positions (recliner chair or wedge pillow) and a pillow under the forearm for support may provide better comfort for up to 4 weeks.  Avoid long distance travel for 4 weeks.  Return to normal activities after rotator cuff repair repair normally takes 6 months on average. If rehab goes very well, may be able to do most activities at 4 months, except overhead or contact sports.  4. Physical Therapy: Begins 3-4 days after surgery, and proceed 1 time per week for the first 6 weeks, then 1-2 times per week from weeks 6-20 post-op.  5. Medications:  - You will be provided a prescription for narcotic pain medicine. After surgery, take 1-2 narcotic tablets every 4 hours if needed for severe pain.  - A prescription for anti-nausea medication will be provided in case the narcotic medicine causes nausea - take 1 tablet every 6 hours only if nauseated.   - Take tylenol 1000 mg (2 Extra Strength tablets or 3 regular strength) every 8 hours for pain.  May decrease or stop tylenol 5 days after surgery if you are having minimal pain. - Take ASA 325mg /day x 2 weeks to help prevent DVTs/PEs (blood clots).  - DO NOT take ANY nonsteroidal anti-inflammatory pain medications (Advil, Motrin, Ibuprofen, Aleve,  Naproxen, or Naprosyn). These medicines can inhibit healing of your shoulder repair.    If you are taking prescription medication for anxiety, depression, insomnia, muscle spasm, chronic pain, or for attention deficit disorder, you are advised that you are at a higher risk of adverse effects with use of narcotics post-op, including narcotic addiction/dependence, depressed breathing, death. If you use non-prescribed substances: alcohol, marijuana, cocaine, heroin, methamphetamines, etc., you are at a higher risk of adverse effects with use of narcotics post-op, including narcotic addiction/dependence, depressed breathing, death. You are advised that taking > 50 morphine milligram equivalents (MME) of narcotic pain medication per day results in twice the risk of overdose or death. For your prescription provided: oxycodone 5 mg - taking more than 6 tablets per day would result in > 50 morphine milligram equivalents (MME) of narcotic pain medication. Be advised that we will prescribe narcotics short-term, for acute post-operative pain only - 3 weeks for major operations such as shoulder repair/reconstruction surgeries.     6. Post-Op Appointment:  Your first post-op appointment will be 10-14 days post-op.  7. Work or School: For most, but not all procedures, we advise staying out of work or school for at least 1 to 2 weeks in order to recover from the stress of surgery and to allow time for healing.   If you need a work or school note this can be provided.   8. Smoking: If you are a smoker, you need to refrain from  smoking in the postoperative period. The nicotine in cigarettes will inhibit healing of your shoulder repair and decrease the chance of successful repair. Similarly, nicotine containing products (gum, patches) should be avoided.   Post-operative Brace: Apply and remove the brace you received as you were instructed to at the time of fitting and as described in detail as the brace's  instructions for use indicate.  Wear the brace for the period of time prescribed by your physician.  The brace can be cleaned with soap and water and allowed to air dry only.  Should the brace result in increased pain, decreased feeling (numbness/tingling), increased swelling or an overall worsening of your medical condition, please contact your doctor immediately.  If an emergency situation occurs as a result of wearing the brace after normal business hours, please dial 911 and seek immediate medical attention.  Let your doctor know if you have any further questions about the brace issued to you. Refer to the shoulder sling instructions for use if you have any questions regarding the correct fit of your shoulder sling.  [facility] Customer Care for Troubleshooting: [phone]  Video that illustrates how to properly use a shoulder sling: "Instructions for Proper Use of an Orthopaedic Sling" http://bass.com/  AMBULATORY SURGERY  DISCHARGE INSTRUCTIONS   The drugs that you were given will stay in your system until tomorrow so for the next 24 hours you should not:  Drive an automobile Make any legal decisions Drink any alcoholic beverage   You may resume regular meals tomorrow.  Today it is better to start with liquids and gradually work up to solid foods.  You may eat anything you prefer, but it is better to start with liquids, then soup and crackers, and gradually work up to solid foods.   Please notify your doctor immediately if you have any unusual bleeding, trouble breathing, redness and pain at the surgery site, drainage, fever, or pain not relieved by medication.    Additional Instructions:  Exparel:   Information for Discharge Teaching:  DO NOT TAKE TEAL EXPAREL BRACELET OFF FOR 4 days (96 hours) 04/09/2023 EXPAREL (bupivacaine liposome injectable suspension)   Your surgeon or anesthesiologist gave you EXPAREL(bupivacaine) to help control your pain after  surgery.  EXPAREL is a local anesthetic that provides pain relief by numbing the tissue around the surgical site. EXPAREL is designed to release pain medication over time and can control pain for up to 72 hours. Depending on how you respond to EXPAREL, you may require less pain medication during your recovery.  Possible side effects: Temporary loss of sensation or ability to move in the area where bupivacaine was injected. Nausea, vomiting, constipation Rarely, numbness and tingling in your mouth or lips, lightheadedness, or anxiety may occur. Call your doctor right away if you think you may be experiencing any of these sensations, or if you have other questions regarding possible side effects.  Follow all other discharge instructions given to you by your surgeon or nurse. Eat a healthy diet and drink plenty of water or other fluids.  If you return to the hospital for any reason within 96 hours following the administration of EXPAREL, it is important for health care providers to know that you have received this anesthetic. A teal colored band has been placed on your arm with the date, time and amount of EXPAREL you have received in order to alert and inform your health care providers. Please leave this armband in place for the full 96 hours following administration, and  then you may remove the band.    POLAR CARE INFORMATION  MassAdvertisement.it  How to use [facility] Polar Care [facility] Therapy System?  YouTube   ShippingScam.co.uk  OPERATING INSTRUCTIONS  Start the product With dry hands, connect the transformer to the electrical connection located on the top of the cooler. Next, plug the transformer into an appropriate electrical outlet. The unit will automatically start running at this point.  To stop the pump, disconnect electrical power.  Unplug to stop the product when not in use. Unplugging the Polar Care unit turns it off. Always unplug immediately after use.  Never leave it plugged in while unattended. Remove pad.    FIRST ADD WATER TO FILL LINE, THEN ICE---Replace ice when existing ice is almost melted  1 Discuss Treatment with your Licensed Health Care Practitioner and Use Only as Prescribed 2 Apply Insulation Barrier & Cold Therapy Pad 3 Check for Moisture 4 Inspect Skin Regularly  Tips and Trouble Shooting Usage Tips 1. Use cubed or chunked ice for optimal performance. 2. It is recommended to drain the Pad between uses. To drain the pad, hold the Pad upright with the hose pointed toward the ground. Depress the black plunger and allow water to drain out. 3. You may disconnect the Pad from the unit without removing the pad from the affected area by depressing the silver tabs on the hose coupling and gently pulling the hoses apart. The Pad and unit will seal itself and will not leak. Note: Some dripping during release is normal. 4. DO NOT RUN PUMP WITHOUT WATER! The pump in this unit is designed to run with water. Running the unit without water will cause permanent damage to the pump. 5. Unplug unit before removing lid.  TROUBLESHOOTING GUIDE Pump not running, Water not flowing to the pad, Pad is not getting cold 1. Make sure the transformer is plugged into the wall outlet. 2. Confirm that the ice and water are filled to the indicated levels. 3. Make sure there are no kinks in the pad. 4. Gently pull on the blue tube to make sure the tube/pad junction is straight. 5. Remove the pad from the treatment site and ll it while the pad is lying at; then reapply. 6. Confirm that the pad couplings are securely attached to the unit. Listen for the double clicks (Figure 1) to confirm the pad couplings are securely attached.  Leaks    Note: Some condensation on the lines, controller, and pads is unavoidable, especially in warmer climates. 1. If using a [employer] Polar Care Cold Therapy unit with a detachable Cold Therapy Pad, and a leak exists (other than  condensation on the lines) disconnect the pad couplings. Make sure the silver tabs on the couplings are depressed before reconnecting the pad to the pump hose; then confirm both sides of the coupling are properly clicked in. 2. If the coupling continues to leak or a leak is detected in the pad itself, stop using it and call [employer] Customer Care at [phone].  Cleaning After use, empty and dry the unit with a soft cloth. Warm water and mild detergent may be used occasionally to clean the pump and tubes.  WARNING: The Polar Care Cube can be cold enough to cause serious injury, including full skin necrosis. Follow these Operating Instructions, and carefully read the Product Insert (see pouch on side of unit) and the Cold Therapy Pad Fitting Instructions (provided with each Cold Therapy Pad) prior to use.

## 2023-04-05 NOTE — Anesthesia Procedure Notes (Signed)
Procedure Name: Intubation Date/Time: 04/05/2023 10:06 AM  Performed by: Mohammed Kindle, CRNAPre-anesthesia Checklist: Patient identified, Emergency Drugs available, Suction available and Patient being monitored Patient Re-evaluated:Patient Re-evaluated prior to induction Oxygen Delivery Method: Circle system utilized Preoxygenation: Pre-oxygenation with 100% oxygen Induction Type: IV induction Ventilation: Mask ventilation without difficulty Laryngoscope Size: McGraph and 3 Grade View: Grade I Tube type: Oral Tube size: 7.0 mm Number of attempts: 1 Airway Equipment and Method: Stylet and Oral airway Placement Confirmation: ETT inserted through vocal cords under direct vision, positive ETCO2, breath sounds checked- equal and bilateral and CO2 detector Secured at: 21 cm Tube secured with: Tape Dental Injury: Teeth and Oropharynx as per pre-operative assessment

## 2023-04-05 NOTE — Op Note (Signed)
SURGERY DATE: 04/05/2023   PRE-OP DIAGNOSIS:  1. Left subacromial impingement 2. Left biceps tendinopathy 3. Left rotator cuff tear 4. Left acromioclavicular joint arthritis  POST-OP DIAGNOSIS: 1. Left subacromial impingement 2. Left biceps tendinopathy 3. Left rotator cuff tear 4. Left acromioclavicular joint arthritis  PROCEDURES:  1. Left arthroscopic rotator cuff repair (supraspinatus and infraspinatus) 2. Left arthroscopic biceps tenodesis 3. Left arthroscopic subacromial decompression 4. Left arthroscopic extensive debridement of shoulder (glenohumeral and subacromial spaces) 5. Left arthroscopic distal clavicle excision  SURGEON: Rosealee Albee, MD   ASSISTANT: Sonny Dandy, PA   ANESTHESIA: Gen with Exparel interscalene block   ESTIMATED BLOOD LOSS: 5cc   DRAINS:  none   TOTAL IV FLUIDS: per anesthesia      SPECIMENS: none   IMPLANTS:  - Arthrex 2.89mm PushLock x 2 - Arthrex 4.58mm SwiveLock x 2 - Arthrex FiberTak RC x 1 - Iconix SPEED double loaded with 1.2 and 2.38mm tape x 2     OPERATIVE FINDINGS:  Examination under anesthesia: A careful examination under anesthesia was performed.  Passive range of motion was: FF: 160; ER at side: 80; ER in abduction: 120; IR in abduction: 50.  Anterior load shift: NT.  Posterior load shift: NT.  Sulcus in neutral: NT.  Sulcus in ER: NT.     Intra-operative findings: A thorough arthroscopic examination of the shoulder was performed.  The findings are: 1. Biceps tendon: tendinopathy with significant erythema as well as split-thickness tearing 2. Superior labrum: erythema 3. Posterior labrum and capsule: normal 4. Inferior capsule and inferior recess: normal 5. Glenoid cartilage surface: Normal 6. Supraspinatus attachment: full-thickness tear of the supraspinatus 7. Posterior rotator cuff attachment: full-thickness tear of the infraspinatus 8. Humeral head articular cartilage: normal 9. Rotator interval: significant  synovitis 10: Subscapularis tendon: attachment intact 11. Anterior labrum: Mildly degenerative 12. IGHL: normal   OPERATIVE REPORT:    Indications for procedure:  Trevor Boyd is a 62 y.o. male with ~5 weeks of L shoulder pain after he had a fall.  He has not been able to lift his arm overhead since that time. He has also had significant pain with shoulder motion and difficulty with sleep. Prior to this injury, he had no shoulder pain or dysfunction. Clinical exam and MRI were suggestive of rotator cuff tear, biceps tendinopathy, acromioclavicular joint arthritis and subacromial impingement. After discussion of risks, benefits, and alternatives to surgery, the patient elected to proceed.    Procedure in detail:   I identified TOBIAS DIRCKS in the pre-operative holding area.  I marked the operative shoulder with my initials. I reviewed the risks and benefits of the proposed surgical intervention, and the patient wished to proceed.  Anesthesia was then performed with an Exparel interscalene block.  The patient was transferred to the operative suite and placed in the beach chair position.     Appropriate IV antibiotics were administered prior to incision. The operative upper extremity was then prepped and draped in standard fashion. A time out was performed confirming the correct extremity, correct patient, and correct procedure.    I then created a standard posterior portal with an 11 blade. The glenohumeral joint was easily entered with a blunt trocar and the arthroscope introduced. The findings of diagnostic arthroscopy are described above. I debrided degenerative tissue including the synovitic tissue about the rotator interval and anterior and superior labrum. I then coagulated the inflamed synovium to obtain hemostasis and reduce the risk of post-operative swelling using an Arthrocare  radiofrequency device.   I then turned my attention to the arthroscopic biceps tenodesis. The Loop n Tack technique  was used to pass a FiberTape through the biceps in a locked fashion adjacent to the biceps anchor.  A hole for a 2.9 mm Arthrex PushLock was drilled in the bicipital groove just superior to the subscapularis tendon insertion.  The biceps tendon was then cut and the biceps anchor complex was debrided down to a stable base on the superior labrum.  The FiberTape was loaded onto the PushLock anchor and impacted into place into the previously drilled hole in the bicipital groove.  This appropriately secured the biceps into the bicipital groove and took it off of tension.   Next, the arthroscope was then introduced into the subacromial space. A direct lateral portal was created with an 11-blade after spinal needle localization. An extensive subacromial bursectomy and debridement was performed using a combination of the shaver and Arthrocare wand. The entire acromial undersurface was exposed and the CA ligament was subperiosteally elevated to expose the anterior acromial hook. A burr was used to create a flat anterior and lateral aspect of the acromion, converting it from a Type 2 to a Type 1 acromion. Care was made to keep the deltoid fascia intact.   I then turned my attention to the arthroscopic distal clavicle excision. I identified the acromioclavicular joint. Surrounding bursal tissue was debrided and the edges of the joint were identified. I used the 5.70mm barrel burr to remove the distal clavicle parallel to the edge of the acromion. I was able to fit two widths of the burr into the space between the distal clavicle and acromion, signifying that I had removed ~62mm of distal clavicle. This was confirmed by viewing anteriorly and introducing a probe with measuring marks from the lateral portal. Hemostasis was achieved with an Arthrocare wand.   Next, I created an accessory posterolateral portal to assist with visualization and instrumentation.  I debrided the poor quality edges of the supraspinatus tendon.   This was a large, U-shaped tear involving the entire supraspinatus and essentially the entire infraspinatus.  It was retracted to the level of the articular margin. It could easily be mobilized with a rotator cuff grasper to its native footprint. I prepared the footprint using a burr to expose bleeding bone.     I then percutaneously placed 1 Iconix SPEED medial row anchor along the anterior portion of the tear at the articular margin. Another SPEED anchor was placed along the posterior portion of the tear at the articular margin. Given the anterior-posterior dimension of the tear, a 3rd central medial row anchor was placed utilizing a double loaded Arthrex FiberTak RC. I then shuttled all 12 strands of tape through the rotator cuff just lateral to the musculotendinous junction using a FirstPass suture passer spanning the anterior to posterior extent of the tear. The posterior strands of each suture were passed through an [employer] anchor.  This was placed approximately 2 cm distal to the lateral edge of the footprint in line with the posterior aspect of the tear with appropriate tensioning of each suture prior to final fixation.  Similarly, the anterior strands of each suture were passed through another SwiveLock anchor along the anterior margin of the tear.  This reduced the posterior aspect of the rotator cuff well. However, there was still a small area of exposed footprint anteriorly. A horizontal mattress suture was placed using the suture passer in the anterior supraspinatus. This was loaded onto  an Designer, industrial/product. With appropriate tension, these sutures were placed in the anterior aspect of the greater tuberosity.  This construct allowed for excellent reapproximation of the rotator cuff to its native footprint without undue tension.  Appropriate compression was achieved.  The repair was stable to external and internal rotation.   Fluid was evacuated from the shoulder, and the portals were  closed with 3-0 Nylon. Xeroform was applied to the portals. A sterile dressing was applied, followed by a Polar Care sleeve and a SlingShot shoulder immobilizer/sling. The patient was awakened from anesthesia without difficulty and was transferred to the PACU in stable condition.    Of note, assistance from a PA was essential to performing the surgery.  PA was present for the entire surgery.  PA assisted with patient positioning, retraction, instrumentation, and wound closure. The surgery would have been more difficult and had longer operative time without PA assistance.   COMPLICATIONS: none   DISPOSITION: plan for discharge home after recovery in PACU     POSTOPERATIVE PLAN: Remain in sling (except hygiene and elbow/wrist/hand RoM exercises as instructed by PT) x 6 weeks and NWB for this time. PT to begin 3-4 days after surgery.  Large rotator cuff repair rehab protocol. ASA 325mg  daily x 2 weeks for DVT ppx.

## 2023-04-05 NOTE — Anesthesia Procedure Notes (Addendum)
Anesthesia Regional Block: Interscalene brachial plexus block   Pre-Anesthetic Checklist: , timeout performed,  Correct Patient, Correct Site, Correct Laterality,  Correct Procedure, Correct Position, site marked,  Risks and benefits discussed,  Surgical consent,  Pre-op evaluation,  At surgeon's request and post-op pain management  Laterality: Left  Prep: chloraprep       Needles:  Injection technique: Single-shot  Needle Type: Echogenic Needle     Needle Length: 4cm  Needle Gauge: 25     Additional Needles:   Procedures:,,,, ultrasound used (permanent image in chart),,    Narrative:  Start time: 04/05/2023 9:48 AM End time: 04/05/2023 9:50 AM Injection made incrementally with aspirations every 5 mL.  Performed by: Personally  Anesthesiologist: Stephanie Coup, MD  Additional Notes: Patient's chart reviewed and they were deemed appropriate candidate for procedure, at surgeon's request. Patient educated about risks, benefits, and alternatives of the block including but not limited to: temporary or permanent nerve damage, bleeding, infection, damage to surround tissues, pneumothorax, hemidiaphragmatic paralysis, unilateral Horner's syndrome, block failure, local anesthetic toxicity. Patient expressed understanding. A formal time-out was conducted consistent with institution rules.  Monitors were applied, and minimal sedation used (see nursing record). The site was prepped with skin prep and allowed to dry, and sterile gloves were used. A high frequency linear ultrasound probe with probe cover was utilized throughout. C5-7 nerve roots located and appeared anatomically normal, local anesthetic injected around them, and echogenic block needle trajectory was monitored throughout. Aspiration performed every 5ml. Lung and blood vessels were avoided. All injections were performed without resistance and free of blood and paresthesias. The patient tolerated the procedure well.  Injectate:  20ml exparel + 10ml 0.5% bupivacaine

## 2023-04-05 NOTE — Transfer of Care (Signed)
Immediate Anesthesia Transfer of Care Note  Patient: Trevor Boyd  Procedure(s) Performed: Left shoulder arthroscopic rotator cuff repair, biceps tenodesis, distal clavicle excision, subacromial decompression (Left: Shoulder)  Patient Location: PACU  Anesthesia Type:General  Level of Consciousness: awake, drowsy, and patient cooperative  Airway & Oxygen Therapy: Patient Spontanous Breathing and Patient connected to face mask oxygen  Post-op Assessment: Report given to RN and Post -op Vital signs reviewed and stable  Post vital signs: Reviewed and stable  Last Vitals:  Vitals Value Taken Time  BP 115/82 04/05/23 1400  Temp    Pulse 78 04/05/23 1403  Resp 23 04/05/23 1403  SpO2 96 % 04/05/23 1403  Vitals shown include unfiled device data.  Last Pain:  Vitals:   04/05/23 0751  TempSrc: Temporal  PainSc: 0-No pain      Patients Stated Pain Goal: 0 (04/05/23 0751)  Complications: No notable events documented.

## 2023-04-05 NOTE — Anesthesia Preprocedure Evaluation (Signed)
Anesthesia Evaluation  Patient identified by MRN, date of birth, ID band Patient awake    Reviewed: Allergy & Precautions, NPO status , Patient's Chart, lab work & pertinent test results  Airway Mallampati: III  TM Distance: >3 FB Neck ROM: full    Dental  (+) Chipped, Dental Advidsory Given   Pulmonary neg pulmonary ROS   Pulmonary exam normal        Cardiovascular negative cardio ROS Normal cardiovascular exam     Neuro/Psych  PSYCHIATRIC DISORDERS  Depression    negative neurological ROS     GI/Hepatic negative GI ROS, Neg liver ROS,,,  Endo/Other  negative endocrine ROS    Renal/GU      Musculoskeletal   Abdominal   Peds  Hematology negative hematology ROS (+)   Anesthesia Other Findings Past Medical History: 10/2022: AKI (acute kidney injury) (HCC)     Comment:  due to taking meloxicam-pcp took pt off med and kidney               function went back to normal limits No date: Depression 08/08/2019: DVT (deep venous thrombosis) (HCC)     Comment:  Right calf, behind knee, and right thigh No date: Elevated alkaline phosphatase level No date: Elevated blood pressure reading without diagnosis of  hypertension No date: Osteoarthritis of right knee No date: Pre-diabetes  Past Surgical History: 07/20/2013: COLONOSCOPY     Comment:  Dr. Dow Adolph, [facility]; Impression: one 3mm polyp in the              rectum. Lymphoid aggregate. Repeat 07/25/2018 Dr. Shelle Iron 04/11/2020: COLONOSCOPY WITH PROPOFOL; N/A     Comment:  Procedure: COLONOSCOPY WITH PROPOFOL;  Surgeon:               Pasty Spillers, MD;  Location: [facility] ENDOSCOPY;                Service: Gastroenterology;  Laterality: N/A; 04/02/2022: COLONOSCOPY WITH PROPOFOL; N/A     Comment:  Procedure: COLONOSCOPY WITH PROPOFOL;  Surgeon: Toney Reil, MD;  Location: [facility] ENDOSCOPY;  Service:               Gastroenterology;  Laterality:  N/A; No date: WRIST SURGERY; Left  BMI    Body Mass Index: 26.50 kg/m      Reproductive/Obstetrics negative OB ROS                             Anesthesia Physical Anesthesia Plan  ASA: 2  Anesthesia Plan: General ETT and General   Post-op Pain Management: Regional block*   Induction: Intravenous  PONV Risk Score and Plan: Ondansetron, Dexamethasone and Midazolam  Airway Management Planned: Oral ETT  Additional Equipment:   Intra-op Plan:   Post-operative Plan: Extubation in OR  Informed Consent: I have reviewed the patients History and Physical, chart, labs and discussed the procedure including the risks, benefits and alternatives for the proposed anesthesia with the patient or authorized representative who has indicated his/her understanding and acceptance.     Dental Advisory Given  Plan Discussed with: Anesthesiologist, CRNA and Surgeon  Anesthesia Plan Comments: (Patient consented for risks of anesthesia including but not limited to:  - adverse reactions to medications - damage to eyes, teeth, lips or other oral mucosa - nerve damage due to positioning  - sore throat or hoarseness - Damage  to heart, brain, nerves, lungs, other parts of body or loss of life  Patient voiced understanding.)       Anesthesia Quick Evaluation

## 2023-04-06 ENCOUNTER — Encounter: Payer: Self-pay | Admitting: Orthopedic Surgery

## 2023-04-07 NOTE — Anesthesia Postprocedure Evaluation (Signed)
Anesthesia Post Note  Patient: [name]  Procedure(s) Performed: Left shoulder arthroscopic rotator cuff repair, biceps tenodesis, distal clavicle excision, subacromial decompression (Left: Shoulder)  Patient location during evaluation: PACU Anesthesia Type: General Level of consciousness: awake and alert Pain management: pain level controlled Vital Signs Assessment: post-procedure vital signs reviewed and stable Respiratory status: spontaneous breathing, nonlabored ventilation, respiratory function stable and patient connected to nasal cannula oxygen Cardiovascular status: blood pressure returned to baseline and stable Postop Assessment: no apparent nausea or vomiting Anesthetic complications: no   No notable events documented.   Last Vitals:  Vitals:   04/05/23 1430 04/05/23 1447  BP: 116/82 120/80  Pulse: 80 78  Resp: (!) 22 18  Temp: 36.5 C 36.5 C  SpO2: 96% 96%    Last Pain:  Vitals:   04/05/23 1447  TempSrc: Oral  PainSc: 3                  Trevor Boyd

## 2023-04-11 DIAGNOSIS — Z9889 Other specified postprocedural states: Secondary | ICD-10-CM | POA: Diagnosis not present

## 2023-04-11 DIAGNOSIS — M6281 Muscle weakness (generalized): Secondary | ICD-10-CM | POA: Diagnosis not present

## 2023-04-11 DIAGNOSIS — M25612 Stiffness of left shoulder, not elsewhere classified: Secondary | ICD-10-CM | POA: Diagnosis not present

## 2023-04-11 DIAGNOSIS — M25512 Pain in left shoulder: Secondary | ICD-10-CM | POA: Diagnosis not present

## 2023-04-20 DIAGNOSIS — Z9889 Other specified postprocedural states: Secondary | ICD-10-CM | POA: Diagnosis not present

## 2023-04-20 DIAGNOSIS — M25612 Stiffness of left shoulder, not elsewhere classified: Secondary | ICD-10-CM | POA: Diagnosis not present

## 2023-04-20 DIAGNOSIS — M6281 Muscle weakness (generalized): Secondary | ICD-10-CM | POA: Diagnosis not present

## 2023-04-20 DIAGNOSIS — M25512 Pain in left shoulder: Secondary | ICD-10-CM | POA: Diagnosis not present

## 2023-05-17 DIAGNOSIS — M6281 Muscle weakness (generalized): Secondary | ICD-10-CM | POA: Diagnosis not present

## 2023-05-17 DIAGNOSIS — M25612 Stiffness of left shoulder, not elsewhere classified: Secondary | ICD-10-CM | POA: Diagnosis not present

## 2023-05-17 DIAGNOSIS — M25512 Pain in left shoulder: Secondary | ICD-10-CM | POA: Diagnosis not present

## 2023-05-17 DIAGNOSIS — Z9889 Other specified postprocedural states: Secondary | ICD-10-CM | POA: Diagnosis not present

## 2023-05-30 DIAGNOSIS — M25612 Stiffness of left shoulder, not elsewhere classified: Secondary | ICD-10-CM | POA: Diagnosis not present

## 2023-05-30 DIAGNOSIS — Z9889 Other specified postprocedural states: Secondary | ICD-10-CM | POA: Diagnosis not present

## 2023-05-30 DIAGNOSIS — M6281 Muscle weakness (generalized): Secondary | ICD-10-CM | POA: Diagnosis not present

## 2023-05-30 DIAGNOSIS — M25512 Pain in left shoulder: Secondary | ICD-10-CM | POA: Diagnosis not present

## 2023-06-01 ENCOUNTER — Encounter: Payer: Self-pay | Admitting: Orthopedic Surgery

## 2023-06-01 DIAGNOSIS — M6281 Muscle weakness (generalized): Secondary | ICD-10-CM | POA: Diagnosis not present

## 2023-06-01 DIAGNOSIS — M25612 Stiffness of left shoulder, not elsewhere classified: Secondary | ICD-10-CM | POA: Diagnosis not present

## 2023-06-01 DIAGNOSIS — M25512 Pain in left shoulder: Secondary | ICD-10-CM | POA: Diagnosis not present

## 2023-06-01 DIAGNOSIS — Z9889 Other specified postprocedural states: Secondary | ICD-10-CM | POA: Diagnosis not present

## 2023-06-06 DIAGNOSIS — M25512 Pain in left shoulder: Secondary | ICD-10-CM | POA: Diagnosis not present

## 2023-06-06 DIAGNOSIS — M6281 Muscle weakness (generalized): Secondary | ICD-10-CM | POA: Diagnosis not present

## 2023-06-06 DIAGNOSIS — M25612 Stiffness of left shoulder, not elsewhere classified: Secondary | ICD-10-CM | POA: Diagnosis not present

## 2023-06-06 DIAGNOSIS — Z9889 Other specified postprocedural states: Secondary | ICD-10-CM | POA: Diagnosis not present

## 2023-06-13 DIAGNOSIS — Z9889 Other specified postprocedural states: Secondary | ICD-10-CM | POA: Diagnosis not present

## 2023-06-13 DIAGNOSIS — M25612 Stiffness of left shoulder, not elsewhere classified: Secondary | ICD-10-CM | POA: Diagnosis not present

## 2023-06-13 DIAGNOSIS — M6281 Muscle weakness (generalized): Secondary | ICD-10-CM | POA: Diagnosis not present

## 2023-06-13 DIAGNOSIS — M25512 Pain in left shoulder: Secondary | ICD-10-CM | POA: Diagnosis not present

## 2023-06-16 ENCOUNTER — Other Ambulatory Visit: Payer: Self-pay | Admitting: Family Medicine

## 2023-06-16 DIAGNOSIS — F439 Reaction to severe stress, unspecified: Secondary | ICD-10-CM

## 2023-06-21 DIAGNOSIS — M25512 Pain in left shoulder: Secondary | ICD-10-CM | POA: Diagnosis not present

## 2023-06-27 DIAGNOSIS — M6281 Muscle weakness (generalized): Secondary | ICD-10-CM | POA: Diagnosis not present

## 2023-06-27 DIAGNOSIS — M25512 Pain in left shoulder: Secondary | ICD-10-CM | POA: Diagnosis not present

## 2023-06-27 DIAGNOSIS — M25612 Stiffness of left shoulder, not elsewhere classified: Secondary | ICD-10-CM | POA: Diagnosis not present

## 2023-06-27 DIAGNOSIS — Z9889 Other specified postprocedural states: Secondary | ICD-10-CM | POA: Diagnosis not present

## 2023-06-30 DIAGNOSIS — Z9889 Other specified postprocedural states: Secondary | ICD-10-CM | POA: Diagnosis not present

## 2023-06-30 DIAGNOSIS — M6281 Muscle weakness (generalized): Secondary | ICD-10-CM | POA: Diagnosis not present

## 2023-06-30 DIAGNOSIS — M25512 Pain in left shoulder: Secondary | ICD-10-CM | POA: Diagnosis not present

## 2023-06-30 DIAGNOSIS — M25612 Stiffness of left shoulder, not elsewhere classified: Secondary | ICD-10-CM | POA: Diagnosis not present

## 2023-07-07 DIAGNOSIS — M25512 Pain in left shoulder: Secondary | ICD-10-CM | POA: Diagnosis not present

## 2023-07-07 DIAGNOSIS — Z9889 Other specified postprocedural states: Secondary | ICD-10-CM | POA: Diagnosis not present

## 2023-07-07 DIAGNOSIS — M25612 Stiffness of left shoulder, not elsewhere classified: Secondary | ICD-10-CM | POA: Diagnosis not present

## 2023-07-07 DIAGNOSIS — M6281 Muscle weakness (generalized): Secondary | ICD-10-CM | POA: Diagnosis not present

## 2023-07-11 DIAGNOSIS — M25612 Stiffness of left shoulder, not elsewhere classified: Secondary | ICD-10-CM | POA: Diagnosis not present

## 2023-07-11 DIAGNOSIS — Z9889 Other specified postprocedural states: Secondary | ICD-10-CM | POA: Diagnosis not present

## 2023-07-11 DIAGNOSIS — M6281 Muscle weakness (generalized): Secondary | ICD-10-CM | POA: Diagnosis not present

## 2023-07-11 DIAGNOSIS — M25512 Pain in left shoulder: Secondary | ICD-10-CM | POA: Diagnosis not present

## 2023-07-13 DIAGNOSIS — Z9889 Other specified postprocedural states: Secondary | ICD-10-CM | POA: Diagnosis not present

## 2023-07-13 DIAGNOSIS — M6281 Muscle weakness (generalized): Secondary | ICD-10-CM | POA: Diagnosis not present

## 2023-07-13 DIAGNOSIS — M25612 Stiffness of left shoulder, not elsewhere classified: Secondary | ICD-10-CM | POA: Diagnosis not present

## 2023-07-13 DIAGNOSIS — M25512 Pain in left shoulder: Secondary | ICD-10-CM | POA: Diagnosis not present

## 2023-07-14 DIAGNOSIS — S46012D Strain of muscle(s) and tendon(s) of the rotator cuff of left shoulder, subsequent encounter: Secondary | ICD-10-CM | POA: Diagnosis not present

## 2023-07-18 DIAGNOSIS — M25612 Stiffness of left shoulder, not elsewhere classified: Secondary | ICD-10-CM | POA: Diagnosis not present

## 2023-07-18 DIAGNOSIS — M25512 Pain in left shoulder: Secondary | ICD-10-CM | POA: Diagnosis not present

## 2023-07-18 DIAGNOSIS — M6281 Muscle weakness (generalized): Secondary | ICD-10-CM | POA: Diagnosis not present

## 2023-07-18 DIAGNOSIS — Z9889 Other specified postprocedural states: Secondary | ICD-10-CM | POA: Diagnosis not present

## 2023-07-20 ENCOUNTER — Encounter: Payer: Self-pay | Admitting: Family Medicine

## 2023-07-20 ENCOUNTER — Ambulatory Visit (INDEPENDENT_AMBULATORY_CARE_PROVIDER_SITE_OTHER): Payer: BC Managed Care – PPO | Admitting: Family Medicine

## 2023-07-20 VITALS — BP 126/97 | HR 72 | Ht 73.0 in | Wt 208.0 lb

## 2023-07-20 DIAGNOSIS — R7303 Prediabetes: Secondary | ICD-10-CM | POA: Diagnosis not present

## 2023-07-20 DIAGNOSIS — I10 Essential (primary) hypertension: Secondary | ICD-10-CM | POA: Insufficient documentation

## 2023-07-20 DIAGNOSIS — E782 Mixed hyperlipidemia: Secondary | ICD-10-CM

## 2023-07-20 DIAGNOSIS — F439 Reaction to severe stress, unspecified: Secondary | ICD-10-CM

## 2023-07-20 DIAGNOSIS — Z0000 Encounter for general adult medical examination without abnormal findings: Secondary | ICD-10-CM

## 2023-07-20 DIAGNOSIS — Z125 Encounter for screening for malignant neoplasm of prostate: Secondary | ICD-10-CM | POA: Insufficient documentation

## 2023-07-20 MED ORDER — SERTRALINE HCL 100 MG PO TABS
100.0000 mg | ORAL_TABLET | Freq: Every day | ORAL | 3 refills | Status: DC
Start: 2023-07-20 — End: 2024-07-05

## 2023-07-20 NOTE — Assessment & Plan Note (Signed)
Chronic; previously elevated The 10-year ASCVD risk score (Arnett DK, et al., 2019) is: 8.2% I continue to recommend diet low in saturated fat and regular exercise - 30 min at least 5 times per week

## 2023-07-20 NOTE — Assessment & Plan Note (Signed)
Chronic, with elevated DBP Continue to monitor use of caffeine Goal of 119/79 May benefit from start of zestoretic with prediabetes Labs pending

## 2023-07-20 NOTE — Progress Notes (Signed)
Complete physical exam   Patient: Trevor Boyd   DOB: 1961-08-11   62 y.o. Male  MRN: [id number] Visit Date: 07/20/2023  Today's healthcare provider: Jacky Kindle, FNP  Re Introduced to nurse practitioner role and practice setting.  All questions answered.  Discussed provider/patient relationship and expectations.  Subjective    Trevor Boyd is a 62 y.o. male who presents today for a complete physical exam.  He reports consuming a general diet. Home exercise routine includes walking 2 hrs per day. He generally feels fairly well. He reports sleeping fairly well. He does have additional problems to discuss today.  HPI HPI   Follow-up Med refill Last edited by Shelly Bombard, CMA on 07/20/2023  8:09 AM.       Presents with request for Zoloft refills;     07/20/2023    8:12 AM 09/08/2022    2:48 PM 05/17/2022   10:36 AM  PHQ9 SCORE ONLY  PHQ-9 Total Score 0 0 0      07/20/2023    8:12 AM  GAD 7 : Generalized Anxiety Score  Nervous, Anxious, on Edge 0  Control/stop worrying 0  Worry too much - different things 0  Trouble relaxing 0  Restless 0  Easily annoyed or irritable 0  Afraid - awful might happen 0  Total GAD 7 Score 0  Anxiety Difficulty Not difficult at all      Past Medical History:  Diagnosis Date   AKI (acute kidney injury) (HCC) 10/2022   due to taking meloxicam-pcp took pt off med and kidney function went back to normal limits   Depression    DVT (deep venous thrombosis) (HCC) 08/08/2019   Right calf, behind knee, and right thigh   Elevated alkaline phosphatase level    Elevated blood pressure reading without diagnosis of hypertension    Osteoarthritis of right knee    Pre-diabetes    Past Surgical History:  Procedure Laterality Date   COLONOSCOPY  07/20/2013   Dr. Dow Adolph, [facility]; Impression: one 3mm polyp in the rectum. Lymphoid aggregate. Repeat 07/25/2018 Dr. Shelle Iron   COLONOSCOPY WITH PROPOFOL N/A 04/11/2020   Procedure: COLONOSCOPY WITH  PROPOFOL;  Surgeon: Pasty Spillers, MD;  Location: [facility]COPY;  Service: Gastroenterology;  Laterality: N/A;   COLONOSCOPY WITH PROPOFOL N/A 04/02/2022   Procedure: COLONOSCOPY WITH PROPOFOL;  Surgeon: Toney Reil, MD;  Location: [facility] ENDOSCOPY;  Service: Gastroenterology;  Laterality: N/A;   SHOULDER ARTHROSCOPY WITH SUBACROMIAL DECOMPRESSION AND OPEN ROTATOR C Left 04/05/2023   Procedure: Left shoulder arthroscopic rotator cuff repair, biceps tenodesis, distal clavicle excision, subacromial decompression;  Surgeon: Signa Kell, MD;  Location: [facility] ORS;  Service: Orthopedics;  Laterality: Left;   WRIST SURGERY Left    Social History   Socioeconomic History   Marital status: Married    Spouse name: Not on file   Number of children: Not on file   Years of education: Not on file   Highest education level: 12th grade  Occupational History   Not on file  Tobacco Use   Smoking status: Never   Smokeless tobacco: Never  Vaping Use   Vaping status: Never Used  Substance and Sexual Activity   Alcohol use: No    Alcohol/week: 0.0 standard drinks of alcohol   Drug use: No   Sexual activity: Yes    Partners: Female  Other Topics Concern   Not on file  Social History Narrative   Not on file  Social Determinants of Health   Financial Resource Strain: Low Risk  (07/19/2023)   Overall Financial Resource Strain (CARDIA)    Difficulty of Paying Living Expenses: Not hard at all  Food Insecurity: No Food Insecurity (07/19/2023)   Hunger Vital Sign    Worried About Running Out of Food in the Last Year: Never true    Ran Out of Food in the Last Year: Never true  Transportation Needs: No Transportation Needs (07/19/2023)   PRAPARE - Administrator, Civil Service (Medical): No    Lack of Transportation (Non-Medical): No  Physical Activity: Sufficiently Active (07/19/2023)   Exercise Vital Sign    Days of Exercise per Week: 6 days    Minutes of Exercise per  Session: 100 min  Stress: No Stress Concern Present (07/19/2023)   [employer] of Occupational Health - Occupational Stress Questionnaire    Feeling of Stress : Not at all  Social Connections: Moderately Isolated (07/19/2023)   Social Connection and Isolation Panel [NHANES]    Frequency of Communication with Friends and Family: More than three times a week    Frequency of Social Gatherings with Friends and Family: More than three times a week    Attends Religious Services: Never    Database administrator or Organizations: No    Attends Engineer, structural: Not on file    Marital Status: Married  Catering manager Violence: Not on file   Family Status  Relation Name Status   Mother  Alive   Father  Alive   Sister  Alive   MGM  Deceased       died from MI   MGF  Deceased       died from MI   PGM  Deceased   PGF  Deceased       died from old age  No partnership data on file   Family History  Problem Relation Age of Onset   Hypertension Mother    Hypertension Sister    Heart attack Maternal Grandmother    Heart attack Maternal Grandfather    No Known Allergies  Patient Care Team: Jacky Kindle, FNP as PCP - General (Family Medicine) Rickard Patience, MD as Consulting Physician (Hematology and Oncology)   Medications: Outpatient Medications Prior to Visit  Medication Sig   [DISCONTINUED] Multiple Vitamins-Minerals (MENS MULTIVITAMIN PO) Take 1 tablet by mouth daily at 6 (six) AM.   [DISCONTINUED] sertraline (ZOLOFT) 100 MG tablet TAKE 1 TABLET(100 MG) BY MOUTH DAILY   [DISCONTINUED] acetaminophen (TYLENOL) 500 MG tablet Take 2 tablets (1,000 mg total) by mouth every 8 (eight) hours.   [DISCONTINUED] ondansetron (ZOFRAN-ODT) 4 MG disintegrating tablet Take 1 tablet (4 mg total) by mouth every 8 (eight) hours as needed for nausea or vomiting.   [DISCONTINUED] oxyCODONE (ROXICODONE) 5 MG immediate release tablet Take 1-2 tablets (5-10 mg total) by mouth every 4 (four)  hours as needed (pain).   No facility-administered medications prior to visit.    Objective    BP (!) 126/97   Pulse 72   Ht 6\' 1"  (1.854 m)   Wt 208 lb (94.3 kg)   SpO2 98%   BMI 27.44 kg/m   Physical Exam Vitals and nursing note reviewed.  Constitutional:      General: He is awake. He is not in acute distress.    Appearance: Normal appearance. He is well-developed, well-groomed and overweight. He is not ill-appearing, toxic-appearing or diaphoretic.  HENT:  Head: Normocephalic and atraumatic.     Jaw: There is normal jaw occlusion. No trismus, tenderness, swelling or pain on movement.     Salivary Glands: Right salivary gland is not diffusely enlarged or tender. Left salivary gland is not diffusely enlarged or tender.     Right Ear: Hearing, tympanic membrane, ear canal and external ear normal. There is no impacted cerumen.     Left Ear: Hearing, tympanic membrane, ear canal and external ear normal. There is no impacted cerumen.     Nose: Nose normal. No congestion or rhinorrhea.     Right Turbinates: Not enlarged, swollen or pale.     Left Turbinates: Not enlarged, swollen or pale.     Right Sinus: No maxillary sinus tenderness or frontal sinus tenderness.     Left Sinus: No maxillary sinus tenderness or frontal sinus tenderness.     Mouth/Throat:     Lips: Pink.     Mouth: Mucous membranes are moist. No injury, lacerations, oral lesions or angioedema.     Pharynx: Oropharynx is clear. Uvula midline. No pharyngeal swelling, oropharyngeal exudate or posterior oropharyngeal erythema.     Tonsils: No tonsillar exudate or tonsillar abscesses.  Eyes:     General: Lids are normal. Vision grossly intact. Gaze aligned appropriately.        Right eye: No discharge.        Left eye: No discharge.     Extraocular Movements: Extraocular movements intact.     Conjunctiva/sclera: Conjunctivae normal.     Pupils: Pupils are equal, round, and reactive to light.  Neck:     Thyroid: No  thyroid mass, thyromegaly or thyroid tenderness.     Vascular: No carotid bruit.     Trachea: Trachea normal. No tracheal tenderness.  Cardiovascular:     Rate and Rhythm: Normal rate and regular rhythm.     Pulses: Normal pulses.          Carotid pulses are 2+ on the right side and 2+ on the left side.      Radial pulses are 2+ on the right side and 2+ on the left side.       Femoral pulses are 2+ on the right side and 2+ on the left side.      Popliteal pulses are 2+ on the right side and 2+ on the left side.       Dorsalis pedis pulses are 2+ on the right side and 2+ on the left side.       Posterior tibial pulses are 2+ on the right side and 2+ on the left side.     Heart sounds: Normal heart sounds, S1 normal and S2 normal. No murmur heard.    No friction rub. No gallop.  Pulmonary:     Effort: Pulmonary effort is normal. No respiratory distress.     Breath sounds: Normal breath sounds and air entry. No stridor. No wheezing, rhonchi or rales.  Chest:     Chest wall: No tenderness.  Abdominal:     General: Abdomen is flat. Bowel sounds are normal. There is no distension.     Palpations: Abdomen is soft. There is no mass.     Tenderness: There is no abdominal tenderness. There is no guarding or rebound.     Hernia: No hernia is present.  Genitourinary:    Comments: Exam deferred; denies complaints Musculoskeletal:        General: No swelling, tenderness, deformity or signs of injury. Normal range  of motion.     Cervical back: Normal range of motion and neck supple. No rigidity or tenderness.     Right lower leg: No edema.     Left lower leg: No edema.  Lymphadenopathy:     Cervical: No cervical adenopathy.     Right cervical: No superficial, deep or posterior cervical adenopathy.    Left cervical: No superficial, deep or posterior cervical adenopathy.  Skin:    General: Skin is warm and dry.     Capillary Refill: Capillary refill takes less than 2 seconds.     Coloration:  Skin is not jaundiced or pale.     Findings: No bruising, erythema, lesion or rash.  Neurological:     General: No focal deficit present.     Mental Status: He is alert and oriented to person, place, and time. Mental status is at baseline.     GCS: GCS eye subscore is 4. GCS verbal subscore is 5. GCS motor subscore is 6.     Sensory: Sensation is intact. No sensory deficit.     Motor: Motor function is intact. No weakness.     Coordination: Coordination is intact.     Gait: Gait is intact.  Psychiatric:        Attention and Perception: Attention and perception normal.        Mood and Affect: Mood and affect normal.        Speech: Speech normal.        Behavior: Behavior normal. Behavior is cooperative.        Thought Content: Thought content normal.        Cognition and Memory: Cognition normal.        Judgment: Judgment normal.     Last depression screening scores    07/20/2023    8:12 AM 09/08/2022    2:48 PM 05/17/2022   10:36 AM  PHQ 2/9 Scores  PHQ - 2 Score 0 0 0  PHQ- 9 Score 0 0 0   Last fall risk screening    07/20/2023    8:12 AM  Fall Risk   Falls in the past year? 1  Number falls in past yr: 0  Injury with Fall? --  Comment shoulder   Last Audit-C alcohol use screening    07/19/2023    5:31 PM  Alcohol Use Disorder Test (AUDIT)  1. How often do you have a drink containing alcohol? 0  3. How often do you have six or more drinks on one occasion? 0   A score of 3 or more in women, and 4 or more in men indicates increased risk for alcohol abuse, EXCEPT if all of the points are from question 1   No results found for any visits on 07/20/23.  Assessment & Plan    Routine Health Maintenance and Physical Exam  Exercise Activities and Dietary recommendations  Goals   None     Immunization History  Administered Date(s) Administered   Influenza,inj,Quad PF,6+ Mos 06/28/2019   Td 01/18/2019   Tdap 07/27/2005    Health Maintenance  Topic Date Due    Zoster Vaccines- Shingrix (1 of 2) Never done   COVID-19 Vaccine (1 - 2023-24 season) Never done   INFLUENZA VACCINE  12/05/2023 (Originally 04/07/2023)   DTaP/Tdap/Td (3 - Td or Tdap) 01/17/2029   Colonoscopy  04/02/2029   Hepatitis C Screening  Completed   HIV Screening  Completed   HPV VACCINES  Aged Out    Discussed health  benefits of physical activity, and encouraged him to engage in regular exercise appropriate for his age and condition.  Problem List Items Addressed This Visit       Cardiovascular and Mediastinum   Primary hypertension    Chronic, with elevated DBP Continue to monitor use of caffeine Goal of 119/79 May benefit from start of zestoretic with prediabetes Labs pending      Relevant Orders   CBC with Differential/Platelet   Comprehensive Metabolic Panel (CMET)   TSH     Other   Annual physical exam - Primary    Things to do to keep yourself healthy  - Exercise at least 30-45 minutes a day, 3-4 days a week.  - Eat a low-fat diet with lots of fruits and vegetables, up to 7-9 servings per day.  - Seatbelts can save your life. Wear them always.  - Smoke detectors on every level of your home, check batteries every year.  - Eye Doctor - have an eye exam every 1-2 years  - Safe sex - if you may be exposed to STDs, use a condom.  - Alcohol -  If you drink, do it moderately, less than 2 drinks per day.  - Health Care Power of Attorney. Choose someone to speak for you if you are not able.  - Depression is common in our stressful world.If you're feeling down or losing interest in things you normally enjoy, please come in for a visit.  - Violence - If anyone is threatening or hurting you, please call immediately.       Relevant Orders   CBC with Differential/Platelet   Comprehensive Metabolic Panel (CMET)   TSH   Hemoglobin A1c   PSA   Lipid panel   Vitamin D (25 hydroxy)   Mixed hyperlipidemia    Chronic; previously elevated The 10-year ASCVD risk score  (Arnett DK, et al., 2019) is: 8.2% I continue to recommend diet low in saturated fat and regular exercise - 30 min at least 5 times per week       Relevant Orders   Lipid panel   Prediabetes    Chronic; controlled with diet Repeat A1c Continue to recommend balanced, lower carb meals. Smaller meal size, adding snacks. Choosing water as drink of choice and increasing purposeful exercise.       Relevant Orders   Hemoglobin A1c   Screening PSA (prostate specific antigen)    Denies LUTS; recommend PSA in place of DRE. If PSA is elevated for age, we will repeat; if PSA remains elevated pt will be referred to urology for DRE and next steps for best treatment.       Relevant Orders   PSA   Situational stress    Chronic, stable Wishes to continue zoloft at current dose PHQ and GAD reviewed      Relevant Medications   sertraline (ZOLOFT) 100 MG tablet   No follow-ups on file.    Leilani Merl, FNP, have reviewed all documentation for this visit. The documentation on 07/20/23 for the exam, diagnosis, procedures, and orders are all accurate and complete.  Jacky Kindle, FNP  [facility] [phone] (phone) [phone] (fax)  [facility]

## 2023-07-20 NOTE — Assessment & Plan Note (Signed)
Denies LUTS; recommend PSA in place of DRE. If PSA is elevated for age, we will repeat; if PSA remains elevated pt will be referred to urology for DRE and next steps for best treatment.  

## 2023-07-20 NOTE — Assessment & Plan Note (Signed)
Chronic; controlled with diet Repeat A1c Continue to recommend balanced, lower carb meals. Smaller meal size, adding snacks. Choosing water as drink of choice and increasing purposeful exercise.

## 2023-07-20 NOTE — Assessment & Plan Note (Signed)
Chronic, stable Wishes to continue zoloft at current dose PHQ and GAD reviewed

## 2023-07-20 NOTE — Assessment & Plan Note (Signed)

## 2023-07-21 LAB — COMPREHENSIVE METABOLIC PANEL
ALT: 22 [IU]/L (ref 0–44)
AST: 19 [IU]/L (ref 0–40)
Albumin: 4.4 g/dL (ref 3.9–4.9)
Alkaline Phosphatase: 174 [IU]/L — ABNORMAL HIGH (ref 44–121)
BUN/Creatinine Ratio: 16 (ref 10–24)
BUN: 16 mg/dL (ref 8–27)
Bilirubin Total: 0.7 mg/dL (ref 0.0–1.2)
CO2: 22 mmol/L (ref 20–29)
Calcium: 9.6 mg/dL (ref 8.6–10.2)
Chloride: 101 mmol/L (ref 96–106)
Creatinine, Ser: 0.99 mg/dL (ref 0.76–1.27)
Globulin, Total: 2.5 g/dL (ref 1.5–4.5)
Glucose: 85 mg/dL (ref 70–99)
Potassium: 4.6 mmol/L (ref 3.5–5.2)
Sodium: 138 mmol/L (ref 134–144)
Total Protein: 6.9 g/dL (ref 6.0–8.5)
eGFR: 87 mL/min/{1.73_m2} (ref >59)

## 2023-07-21 LAB — LIPID PANEL
Chol/HDL Ratio: 3.4 ratio (ref 0.0–5.0)
Cholesterol, Total: 131 mg/dL (ref 100–199)
HDL: 39 mg/dL — ABNORMAL LOW (ref >39)
LDL Chol Calc (NIH): 74 mg/dL (ref 0–99)
Triglycerides: 96 mg/dL (ref 0–149)
VLDL Cholesterol Cal: 18 mg/dL (ref 5–40)

## 2023-07-21 LAB — CBC WITH DIFFERENTIAL/PLATELET
Basophils Absolute: 0 10*3/uL (ref 0.0–0.2)
Basos: 1 %
EOS (ABSOLUTE): 0.1 10*3/uL (ref 0.0–0.4)
Eos: 2 %
Hematocrit: 48.5 % (ref 37.5–51.0)
Hemoglobin: 16.6 g/dL (ref 13.0–17.7)
Immature Grans (Abs): 0 10*3/uL (ref 0.0–0.1)
Immature Granulocytes: 0 %
Lymphocytes Absolute: 1.5 10*3/uL (ref 0.7–3.1)
Lymphs: 28 %
MCH: 30.5 pg (ref 26.6–33.0)
MCHC: 34.2 g/dL (ref 31.5–35.7)
MCV: 89 fL (ref 79–97)
Monocytes Absolute: 0.7 10*3/uL (ref 0.1–0.9)
Monocytes: 12 %
Neutrophils Absolute: 3.1 10*3/uL (ref 1.4–7.0)
Neutrophils: 57 %
Platelets: 275 10*3/uL (ref 150–450)
RBC: 5.44 x10E6/uL (ref 4.14–5.80)
RDW: 12.2 % (ref 11.6–15.4)
WBC: 5.5 10*3/uL (ref 3.4–10.8)

## 2023-07-21 LAB — PSA: Prostate Specific Ag, Serum: 1 ng/mL (ref 0.0–4.0)

## 2023-07-21 LAB — HEMOGLOBIN A1C
Est. average glucose Bld gHb Est-mCnc: 123 mg/dL
Hgb A1c MFr Bld: 5.9 % — ABNORMAL HIGH (ref 4.8–5.6)

## 2023-07-21 LAB — TSH: TSH: 1.41 u[IU]/mL (ref 0.450–4.500)

## 2023-07-21 LAB — VITAMIN D 25 HYDROXY (VIT D DEFICIENCY, FRACTURES): Vit D, 25-Hydroxy: 47.2 ng/mL (ref 30.0–100.0)

## 2023-11-12 ENCOUNTER — Emergency Department
Admission: EM | Admit: 2023-11-12 | Discharge: 2023-11-12 | Disposition: A | Discharge status: Home / Self Care | Attending: Emergency Medicine | Admitting: Emergency Medicine

## 2023-11-12 ENCOUNTER — Other Ambulatory Visit: Payer: Self-pay

## 2023-11-12 ENCOUNTER — Emergency Department

## 2023-11-12 DIAGNOSIS — M25561 Pain in right knee: Secondary | ICD-10-CM | POA: Insufficient documentation

## 2023-11-12 DIAGNOSIS — M1711 Unilateral primary osteoarthritis, right knee: Secondary | ICD-10-CM | POA: Diagnosis not present

## 2023-11-12 MED ORDER — MELOXICAM 15 MG PO TABS: 15.0000 mg | ORAL_TABLET | Freq: Every day | ORAL | 0 refills | Status: AC

## 2023-11-12 NOTE — ED Provider Notes (Signed)
 [facility] Provider Note    Event Date/Time   First MD Initiated Contact with Patient 11/12/23 1403     (approximate)   History   Knee Pain   HPI  Trevor Boyd is a 63 y.o. male with PMH of osteoarthritis of the right knee, depression who presents for evaluation of right knee pain.       Physical Exam   Triage Vital Signs: ED Triage Vitals  Encounter Vitals Group     BP 11/12/23 1155 (!) 131/96     Systolic BP Percentile --      Diastolic BP Percentile --      Pulse Rate 11/12/23 1155 95     Resp 11/12/23 1155 18     Temp 11/12/23 1155 97.9 F (36.6 C)     Temp Source 11/12/23 1155 Oral     SpO2 11/12/23 1155 98 %     Weight --      Height --      Head Circumference --      Peak Flow --      Pain Score 11/12/23 1151 1     Pain Loc --      Pain Education --      Exclude from Growth Chart --     Most recent vital signs: Vitals:   11/12/23 1155  BP: (!) 131/96  Pulse: 95  Resp: 18  Temp: 97.9 F (36.6 C)  SpO2: 98%   General: Awake, no distress.  CV:  Good peripheral perfusion.  Resp:  Normal effort.  Abd:  No distention.  Other:  Right knee is mildly swollen when compared with the left, no overlying skin changes, erythema or bruising.  No tenderness to palpation.  Difficult to perform ROM due to pain but does have well-maintained ROM, pain with McMurray's test, negative anterior and posterior drawer, no ligament laxity felt in varus and valgus stress.   ED Results / Procedures / Treatments   Labs (all labs ordered are listed, but only abnormal results are displayed) Labs Reviewed - No data to display  RADIOLOGY  Right knee x-ray obtained, I interpreted the images as well as reviewed the radiologist report which showed tricompartmental arthritis but was negative for fractures.   PROCEDURES:  Critical Care performed: No  Procedures   MEDICATIONS ORDERED IN ED: Medications - No data to display   IMPRESSION / MDM /  ASSESSMENT AND PLAN / ED COURSE  I reviewed the triage vital signs and the nursing notes.                             63 year old male presents for evaluation of right knee pain.  Vital signs stable aside from an elevated blood pressure.  Patient NAD on exam.  Differential diagnosis includes, but is not limited to, fracture, dislocation, ligament injury, meniscus injury, septic joint.  Patient's presentation is most consistent with acute complicated illness / injury requiring diagnostic workup.  Right knee x-ray negative aside from tricompartmental arthritis.  Physical exam did not reveal any ligament laxity.  Difficult to assess meniscus given patient's level of pain.  He will be placed in a knee immobilizer and given crutches.  Will start him on meloxicam.  Recommended follow-up with orthopedics.  He voiced understanding, all questions were answered and he was stable at discharge.      FINAL CLINICAL IMPRESSION(S) / ED DIAGNOSES   Final diagnoses:  Acute pain of  right knee     Rx / DC Orders   ED Discharge Orders          Ordered    meloxicam (MOBIC) 15 MG tablet  Daily        11/12/23 1429             Note:  This document was prepared using Dragon voice recognition software and may include unintentional dictation errors.   Cameron Ali, PA-C 11/12/23 1431    Phineas Semen, MD 11/12/23 [phone]

## 2023-11-12 NOTE — ED Triage Notes (Signed)
 Pt states he was walking in yard this morning and felt a "pop" in his right knee. Pt states he cannot bear weight on effected extremity and has a history of cortisone shots in the right knee joint. CMS intact, no obvious deformity or swelling noted.

## 2023-11-12 NOTE — Discharge Instructions (Addendum)
 Right knee x-ray showed arthritis and no fractures today.  Please wear the knee immobilizer when up walking around.  You can weight-bear as tolerated.  Please follow-up with orthopedics as information is attached.  Please take the meloxicam (Mobic) once a day for 2 weeks.  This is an anti-inflammatory.  Do not take other NSAIDs while taking this medication.  NSAIDs include ibuprofen, Motrin, Advil, naproxen, Aleve, celecoxib, and Celebrex.  You can take 650 mg of Tylenol every 6 hours as needed for pain.  Please elevate and ice your knee as needed.  You can also try muscle creams.

## 2024-03-29 ENCOUNTER — Ambulatory Visit (INDEPENDENT_AMBULATORY_CARE_PROVIDER_SITE_OTHER): Admitting: Family Medicine

## 2024-03-29 ENCOUNTER — Encounter: Payer: Self-pay | Admitting: Family Medicine

## 2024-03-29 VITALS — BP 130/82 | Ht 73.0 in | Wt 210.0 lb

## 2024-03-29 DIAGNOSIS — M1711 Unilateral primary osteoarthritis, right knee: Secondary | ICD-10-CM

## 2024-03-29 MED ORDER — METHYLPREDNISOLONE ACETATE 40 MG/ML IJ SUSP
40.0000 mg | Freq: Once | INTRAMUSCULAR | Status: AC
  Administered 2024-03-29: 40 mg via INTRA_ARTICULAR

## 2024-03-29 NOTE — Patient Instructions (Signed)

## 2024-03-29 NOTE — Assessment & Plan Note (Addendum)
 Low concern for meniscal injury at this time. Most likely flaring of OA. Good candidate for corticosteroid injection given no recent injections and had good relief with last injection.   PLAN: - Right knee corticosteroid injection completed today as noted below - Counseled on risks of daily long term ibuprofen use, encouraged trying to wean use as possible and utilizing topical analgesia like voltaren  gel  - heat or ice prn - F/u as needed, would be eligible for next injection after 3 months.  Could consider trying to get authorization for gel injections in the future pending response to steroid injection today  Knee Injection  After informed written consent was obtained, patient was seated on exam table. Right knee was prepped with alcohol swab. Utilizing anterior lateral approach, patient's left knee was injected intraarticularly with mixture of 1cc of depomedrol 40mg /mL and 4cc of 1% lidocaine  without epinephrine . Patient tolerated the procedure well without immediate complications. Post procedure care reviewed.

## 2024-03-29 NOTE — Progress Notes (Addendum)
 PCP: Emilio Kelly DASEN, FNP  Subjective:   HPI: Trevor Boyd is a 63 y.o. male here for chronic right knee pain.  Trevor Boyd has history of OA in bilateral knees with R worse than L. Was last seen in our office in 12/2022 for a knee injection at that time. Trevor Boyd said he had great relief with that injection until he had an episode of knee popping while walking in his yard in March 2025. He had gone to the ED at that time. Xrays were negative. They had some concern for possible meniscal injury vs OA flare. He was discharged with knee immobilizer and crutches. Trevor Boyd says his pain improved about a week or so later and he did not need a knee brace or sleeve after that. However, since the injury he has been having more difficulty squatting and walking around and has pain with extending and bending his knee along with cracking. He is interested in another knee injection today.  Has not had any other steroid injections recently.  Trevor Boyd mentions he has been taking 3 pills of ibuprofen 200 mg every day for the past 3-4 years. Recently has been trying to reduce ibuprofen usage. Some days does not use at all.  He is interested in gel injections if his insurance approves it in the future.   Past Medical History:  Diagnosis Date   AKI (acute kidney injury) (HCC) 10/2022   due to taking meloxicam -pcp took pt off med and kidney function went back to normal limits   Depression    DVT (deep venous thrombosis) (HCC) 08/08/2019   Right calf, behind knee, and right thigh   Elevated alkaline phosphatase level    Elevated blood pressure reading without diagnosis of hypertension    Osteoarthritis of right knee    Pre-diabetes     Current Outpatient Medications on File Prior to Visit  Medication Sig Dispense Refill   sertraline  (ZOLOFT ) 100 MG tablet Take 1 tablet (100 mg total) by mouth daily. 90 tablet 3   No current facility-administered medications on file prior to visit.    Past Surgical History:  Procedure  Laterality Date   COLONOSCOPY  07/20/2013   Dr. Donnice Manes, [facility]; Impression: one 3mm polyp in the rectum. Lymphoid aggregate. Repeat 07/25/2018 Dr. Manes   COLONOSCOPY WITH PROPOFOL  N/A 04/11/2020   Procedure: COLONOSCOPY WITH PROPOFOL ;  Surgeon: Janalyn Keene NOVAK, MD;  Location: [facility] ENDOSCOPY;  Service: Gastroenterology;  Laterality: N/A;   COLONOSCOPY WITH PROPOFOL  N/A 04/02/2022   Procedure: COLONOSCOPY WITH PROPOFOL ;  Surgeon: Unk Corinn Skiff, MD;  Location: [facility] ENDOSCOPY;  Service: Gastroenterology;  Laterality: N/A;   SHOULDER ARTHROSCOPY WITH SUBACROMIAL DECOMPRESSION AND OPEN ROTATOR C Left 04/05/2023   Procedure: Left shoulder arthroscopic rotator cuff repair, biceps tenodesis, distal clavicle excision, subacromial decompression;  Surgeon: Tobie Priest, MD;  Location: [facility] ORS;  Service: Orthopedics;  Laterality: Left;   WRIST SURGERY Left     No Known Allergies  BP 130/82   Ht 6' 1 (1.854 m)   Wt 210 lb (95.3 kg)   BMI 27.71 kg/m       No data to display              No data to display              Objective:  Physical Exam:  Gen: NAD, comfortable in exam room MSK:  R knee with small effusion, no increased redness or warmth  No ecchymosis.  No TTP along joint lines, over patella, patellar tendon,  quadriceps tendon. FROM with normal strength. Negative ant/post drawers. Negative valgus/varus testing. Negative lachman.  Negative mcmurrays, thessaly  NV intact distally.  Positive patellar grind test with crepitus on extending and flexing the knee  Walking without a limp Neurovascularly intact distally   Assessment & Plan:   Assessment & Plan Primary osteoarthritis of right knee Low concern for meniscal injury at this time. Most likely flaring of OA. Good candidate for corticosteroid injection given no recent injections and had good relief with last injection.   PLAN: - Right knee corticosteroid injection completed today as noted below -  Counseled on risks of daily long term ibuprofen use, encouraged trying to wean use as possible and utilizing topical analgesia like voltaren  gel  - heat or ice prn - F/u as needed, would be eligible for next injection after 3 months.  Could consider trying to get authorization for gel injections in the future pending response to steroid injection today  Knee Injection  After informed written consent was obtained, Trevor Boyd was seated on exam table. Right knee was prepped with alcohol swab. Utilizing anterior lateral approach, Trevor Boyd's left knee was injected intraarticularly with mixture of 1cc of depomedrol 40mg /mL and 4cc of 1% lidocaine  without epinephrine . Trevor Boyd tolerated the procedure well without immediate complications. Post procedure care reviewed.

## 2024-06-06 ENCOUNTER — Ambulatory Visit: Payer: Self-pay

## 2024-06-06 NOTE — Telephone Encounter (Signed)
 FYI Only or Action Required?: FYI only for provider.  Patient was last seen in primary care on 07/20/2023 by Trevor Kelly DASEN, FNP.  Called Nurse Triage reporting Pain.  Symptoms began x 2 weeks.  Interventions attempted: OTC medications: pain relief cream, cortisone shot .  Symptoms are: gradually worsening.  Triage Disposition: See PCP When Office is Open (Within 3 Days)  Patient/caregiver understands and will follow disposition?: Yes    Copied from CRM [id number]. Topic: Clinical - Red Word Triage >> Jun 06, 2024 10:25 AM [name] wrote: [facility] that prompted transfer to Nurse Triage: Patient is having right knee pain, and states has difficulty walking after sitting for long periods of time, and pain that is worsening. Patient reports has had previous cortisone injections, but only lasted for about a week.  Patient requesting to be seen as soon as possible to be evaluated. Reason for Disposition  [1] MODERATE pain (e.g., interferes with normal activities, limping) AND [2] present > 3 days  Answer Assessment - Initial Assessment Questions 1. ONSET: When did the pain start?      X 2 months and worsening ( 1 week after cortisone shot) 2. LOCATION: Where is the pain located?      Right knee pain 3. PAIN: How bad is the pain?    (Scale 1-10; or mild, moderate, severe)     Walking 5/10 after sitting 8/10 4. WORK OR EXERCISE: Has there been any recent work or exercise that involved this part of the body?      no 5. CAUSE: What do you think is causing the leg pain?     unknown 6. OTHER SYMPTOMS: Do you have any other symptoms? (e.g., chest pain, back pain, breathing difficulty, swelling, rash, fever, numbness, weakness)     Back pain 7/10 x 2  weeks ago & today 2/10 7. PREGNANCY: Is there any chance you are pregnant? When was your last menstrual period?     na  2-4 ibuprofen/day, cream for muscle aches and pain.  Right foot and ankle area has pain at night  only  Protocols used: Leg Pain-A-AH

## 2024-06-07 ENCOUNTER — Ambulatory Visit: Admitting: Physician Assistant

## 2024-06-07 ENCOUNTER — Encounter: Payer: Self-pay | Admitting: Physician Assistant

## 2024-06-07 VITALS — BP 120/84 | HR 83 | Resp 14 | Ht 73.0 in | Wt 213.6 lb

## 2024-06-07 DIAGNOSIS — E782 Mixed hyperlipidemia: Secondary | ICD-10-CM

## 2024-06-07 DIAGNOSIS — R7303 Prediabetes: Secondary | ICD-10-CM | POA: Diagnosis not present

## 2024-06-07 DIAGNOSIS — M79671 Pain in right foot: Secondary | ICD-10-CM | POA: Insufficient documentation

## 2024-06-07 DIAGNOSIS — R12 Heartburn: Secondary | ICD-10-CM | POA: Insufficient documentation

## 2024-06-07 DIAGNOSIS — N1831 Chronic kidney disease, stage 3a: Secondary | ICD-10-CM | POA: Insufficient documentation

## 2024-06-07 DIAGNOSIS — I10 Essential (primary) hypertension: Secondary | ICD-10-CM

## 2024-06-07 DIAGNOSIS — M25569 Pain in unspecified knee: Secondary | ICD-10-CM

## 2024-06-07 MED ORDER — CELECOXIB 200 MG PO CAPS: 200.0000 mg | ORAL_CAPSULE | Freq: Two times a day (BID) | ORAL | 1 refills | Status: DC

## 2024-06-07 NOTE — Progress Notes (Signed)
 Established patient visit  Patient: Trevor Boyd   DOB: November 10, 1960   63 y.o. Male  MRN: [id number] Visit Date: 06/07/2024  Today's healthcare provider: Jolynn Spencer, PA-C   Chief Complaint  Patient presents with   Knee Pain    R knee pain onset 3 years. Worsening pain  Pt gets a cortisone shot last one received in July/Aug. Did not last 3 months as usually does.   Subjective     HPI     Knee Pain    Additional comments: R knee pain onset 3 years. Worsening pain  Pt gets a cortisone shot last one received in July/Aug. Did not last 3 months as usually does.      Last edited by Wilfred Hargis RAMAN, CMA on 06/07/2024  8:08 AM.       Discussed the use of AI scribe software for clinical note transcription with the patient, who gave verbal consent to proceed.  History of Present Illness Trevor Boyd is a 63 year old male with chronic knee pain who presents for management of knee pain and evaluation of treatment options.  He experiences persistent knee pain despite a recent cortisone injection, his fourth, which provided minimal relief. Pain returned after about a week. He uses a knee brace daily, except at night, to manage the pain. The knee pain affects daily activities, including driving, and he has difficulty getting up from the floor and after sitting for extended periods. Recently, while driving back from [facility], his knee pain intensified, raising concerns about his ability to control the vehicle safely.  He takes ibuprofen for pain management, consuming two to four 200 mg tablets daily for the past five years. He occasionally skips doses but experiences increased pain when he does. He previously used meloxicam  but discontinued it due to concerns about kidney function.  He experiences intermittent right foot pain, particularly at night and upon waking, which subsides after being on his feet for about an hour at work. He works at [employer], spending seven to eight hours  a day on his feet.  No chest pain, shortness of breath, rapid heart beating, bowel movement changes, or blood in urine or stool. Occasional heartburn is managed with Tums.       06/07/2024    8:09 AM 07/20/2023    8:12 AM 09/08/2022    2:48 PM  Depression screen PHQ 2/9  Decreased Interest 0 0 0  Down, Depressed, Hopeless 0 0 0  PHQ - 2 Score 0 0 0  Altered sleeping  0 0  Tired, decreased energy  0 0  Change in appetite  0 0  Feeling bad or failure about yourself   0 0  Trouble concentrating  0 0  Moving slowly or fidgety/restless  0 0  Suicidal thoughts  0 0  PHQ-9 Score  0 0  Difficult doing work/chores  Not difficult at all Not difficult at all      07/20/2023    8:12 AM  GAD 7 : Generalized Anxiety Score  Nervous, Anxious, on Edge 0  Control/stop worrying 0  Worry too much - different things 0  Trouble relaxing 0  Restless 0  Easily annoyed or irritable 0  Afraid - awful might happen 0  Total GAD 7 Score 0  Anxiety Difficulty Not difficult at all    Medications: Outpatient Medications Prior to Visit  Medication Sig   sertraline  (ZOLOFT ) 100 MG tablet Take 1 tablet (100 mg total) by mouth  daily.   No facility-administered medications prior to visit.    Review of Systems All negative Except see HPI       Objective    BP 120/84   Pulse 83   Resp 14   Ht 6' 1 (1.854 m)   Wt 213 lb 9.6 oz (96.9 kg)   SpO2 97%   BMI 28.18 kg/m     Physical Exam Vitals reviewed.  Constitutional:      General: He is not in acute distress.    Appearance: Normal appearance. He is not diaphoretic.  HENT:     Head: Normocephalic and atraumatic.  Eyes:     General: No scleral icterus.    Conjunctiva/sclera: Conjunctivae normal.  Cardiovascular:     Rate and Rhythm: Normal rate and regular rhythm.     Pulses: Normal pulses.     Heart sounds: Normal heart sounds. No murmur heard. Pulmonary:     Effort: Pulmonary effort is normal. No respiratory distress.      Breath sounds: Normal breath sounds. No wheezing or rhonchi.  Musculoskeletal:        General: Tenderness (R knee >L knee) present. No swelling.     Cervical back: Neck supple.     Right lower leg: No edema.     Left lower leg: No edema.  Lymphadenopathy:     Cervical: No cervical adenopathy.  Skin:    General: Skin is warm and dry.     Findings: No rash.  Neurological:     Mental Status: He is alert and oriented to person, place, and time. Mental status is at baseline.  Psychiatric:        Mood and Affect: Mood normal.        Behavior: Behavior normal.      No results found for any visits on 06/07/24.      Assessment & Plan Chronic right knee pain Chronic right knee pain with limited relief from cortisone injection. Gel injections denied by insurance. Discussed alternative management options and potential benefits of gel injections. - Refer to local pain management specialist for further evaluation and management. - Refer to orthopedic specialist for reassessment and potential surgical options. - Initiate physical therapy to improve knee function and pain management. - Prescribe Voltaren  gel for topical pain relief. - Advise use of knee brace during prolonged activities and driving. - Recommend alternating ibuprofen with Tylenol  to reduce NSAID use.  Right foot pain, likely tendinitis Intermittent right foot pain, likely tendinitis, exacerbated by prolonged standing and activity. - Refer to general orthopedics for evaluation of foot pain. - Recommend use of compression stockings for support. - Advise on exercises for joint flexibility and use of heat and cold compresses. - Suggest use of Voltaren  gel for topical pain relief. - Recommend massage and flexibility exercises for the foot.  Chronic kidney disease, stage 3 Chronic kidney disease stage 3 with NSAID use potentially contributing to renal impairment. Discussed risks of long-term NSAID use. - Order blood work to  assess current kidney function. - Advise reducing NSAID use and increasing water intake to 8-12 glasses daily. - Prescribe Celebrex as an alternative to ibuprofen, with caution regarding renal function.  Hyperlipidemia Chronic Hyperlipidemia with previous suboptimal lipid levels. - Order lipid panel to assess current lipid levels. Will reassess after  receiving lab results Will FU  Heartburn Chronic Intermittent heartburn, possibly related to NSAID use. - Consider prescribing medication for heartburn if symptoms persist. Will follow-up  Knee pain, unspecified chronicity, unspecified laterality (  Primary)  - Ambulatory referral to Orthopedics - Ambulatory referral to Pain Clinic - Ambulatory referral to Physical Therapy - celecoxib (CELEBREX) 200 MG capsule; Take 1 capsule (200 mg total) by mouth 2 (two) times daily.  Dispense: 60 capsule; Refill: 1  Primary hypertension  - Lipid panel - Comprehensive metabolic panel with GFR  Prediabetes  - Lipid panel - Comprehensive metabolic panel with GFR  Mixed hyperlipidemia  - Lipid panel - Comprehensive metabolic panel with GFR   Stage 3a chronic kidney disease (HCC)  - Lipid panel - Comprehensive metabolic panel with GFR  No orders of the defined types were placed in this encounter.   No follow-ups on file.   The patient was advised to call back or seek an in-person evaluation if the symptoms worsen or if the condition fails to improve as anticipated.  I discussed the assessment and treatment plan with the patient. The patient was provided an opportunity to ask questions and all were answered. The patient agreed with the plan and demonstrated an understanding of the instructions.  I, Ivania Teagarden, PA-C have reviewed all documentation for this visit. The documentation on 06/07/2024  for the exam, diagnosis, procedures, and orders are all accurate and complete.  Jolynn Spencer, [facility], MMS [facility] [phone] (phone) [phone] (fax)  [facility]

## 2024-06-08 LAB — LIPID PANEL
Chol/HDL Ratio: 3.8 ratio (ref 0.0–5.0)
Cholesterol, Total: 134 mg/dL (ref 100–199)
HDL: 35 mg/dL — ABNORMAL LOW (ref >39)
LDL Chol Calc (NIH): 84 mg/dL (ref 0–99)
Triglycerides: 75 mg/dL (ref 0–149)
VLDL Cholesterol Cal: 15 mg/dL (ref 5–40)

## 2024-06-08 LAB — COMPREHENSIVE METABOLIC PANEL WITH GFR
ALT: 17 IU/L (ref 0–44)
AST: 17 IU/L (ref 0–40)
Albumin: 4.4 g/dL (ref 3.9–4.9)
Alkaline Phosphatase: 128 IU/L — ABNORMAL HIGH (ref 47–123)
BUN/Creatinine Ratio: 19 (ref 10–24)
BUN: 21 mg/dL (ref 8–27)
Bilirubin Total: 0.9 mg/dL (ref 0.0–1.2)
CO2: 20 mmol/L (ref 20–29)
Calcium: 9.1 mg/dL (ref 8.6–10.2)
Chloride: 103 mmol/L (ref 96–106)
Creatinine, Ser: 1.1 mg/dL (ref 0.76–1.27)
Globulin, Total: 2.2 g/dL (ref 1.5–4.5)
Glucose: 119 mg/dL — ABNORMAL HIGH (ref 70–99)
Potassium: 3.9 mmol/L (ref 3.5–5.2)
Sodium: 139 mmol/L (ref 134–144)
Total Protein: 6.6 g/dL (ref 6.0–8.5)
eGFR: 76 mL/min/1.73 (ref >59)

## 2024-06-12 ENCOUNTER — Ambulatory Visit: Payer: Self-pay | Admitting: Physician Assistant

## 2024-06-15 ENCOUNTER — Ambulatory Visit (INDEPENDENT_AMBULATORY_CARE_PROVIDER_SITE_OTHER)

## 2024-06-15 DIAGNOSIS — M1711 Unilateral primary osteoarthritis, right knee: Secondary | ICD-10-CM | POA: Diagnosis not present

## 2024-06-15 DIAGNOSIS — G8929 Other chronic pain: Secondary | ICD-10-CM | POA: Diagnosis not present

## 2024-06-15 DIAGNOSIS — M25561 Pain in right knee: Secondary | ICD-10-CM | POA: Diagnosis not present

## 2024-06-15 NOTE — Progress Notes (Signed)
 Orthopaedic Surgery New Patient Visit   History of Present Illness: The patient is a 63 y.o. male seen in clinic for right knee pain.  Patient states symptoms presented about 5 years ago.  He attributes symptoms to having started while working at [employer].  He states his work requires a lot of standing and walking on concrete and it has taken a toll on his right knee.  He does recall having twisted his knee around the time symptoms started as well.  He notes that pain is localized around the kneecap.  He recalls an event where he thinks his kneecap may have dislocated as well.  Pain has been getting worse in the last few years.  He has tried numerous treatment options including Celebrex, Voltaren  gel, 4 corticosteroid injections, Tylenol  and a brace.  His most recent corticosteroid injection was administered last month and only provided relief for 1 week.  All of these interventions have stopped working as of late.  Associated symptoms include locking, stiffness and subjective instability.  No other complaints today.  Occupation: Psychologist, sport and exercise at [employer]     Past Medical, Social and Family History: Past Medical History:  Diagnosis Date   AKI (acute kidney injury) 10/2022   due to taking meloxicam -pcp took pt off med and kidney function went back to normal limits   Depression    DVT (deep venous thrombosis) (HCC) 08/08/2019   Right calf, behind knee, and right thigh   Elevated alkaline phosphatase level    Elevated blood pressure reading without diagnosis of hypertension    Osteoarthritis of right knee    Pre-diabetes    Past Surgical History:  Procedure Laterality Date   COLONOSCOPY  07/20/2013   Dr. Donnice Manes, [facility]; Impression: one 3mm polyp in the rectum. Lymphoid aggregate. Repeat 07/25/2018 Dr. Manes   COLONOSCOPY WITH PROPOFOL  N/A 04/11/2020   Procedure: COLONOSCOPY WITH PROPOFOL ;  Surgeon: Janalyn Keene NOVAK, MD;  Location: [facility] ENDOSCOPY;  Service: Gastroenterology;   Laterality: N/A;   COLONOSCOPY WITH PROPOFOL  N/A 04/02/2022   Procedure: COLONOSCOPY WITH PROPOFOL ;  Surgeon: Unk Corinn Skiff, MD;  Location: [facility] ENDOSCOPY;  Service: Gastroenterology;  Laterality: N/A;   SHOULDER ARTHROSCOPY WITH SUBACROMIAL DECOMPRESSION AND OPEN ROTATOR C Left 04/05/2023   Procedure: Left shoulder arthroscopic rotator cuff repair, biceps tenodesis, distal clavicle excision, subacromial decompression;  Surgeon: Tobie Priest, MD;  Location: [facility] ORS;  Service: Orthopedics;  Laterality: Left;   WRIST SURGERY Left    No Known Allergies Current Outpatient Medications on File Prior to Visit  Medication Sig Dispense Refill   celecoxib (CELEBREX) 200 MG capsule Take 1 capsule (200 mg total) by mouth 2 (two) times daily. 60 capsule 1   sertraline  (ZOLOFT ) 100 MG tablet Take 1 tablet (100 mg total) by mouth daily. 90 tablet 3   No current facility-administered medications on file prior to visit.   Social History   Tobacco Use   Smoking status: Never   Smokeless tobacco: Never  Vaping Use   Vaping status: Never Used  Substance Use Topics   Alcohol use: No    Alcohol/week: 0.0 standard drinks of alcohol   Drug use: No      I have reviewed past medical, surgical, social and family history, medications and allergies as documented in the EMR.  Review of Systems - A ROS was performed including pertinent positives and negatives as documented in the HPI.     Physical Exam:  General/Constitutional: NAD Vascular: No edema, swelling or tenderness, except as  noted in detailed exam Integumentary: No impressive skin lesions present, except as noted in detailed exam Neuro/Psych: Normal mood and affect, oriented to person, place and time Musculoskeletal: Normal, except as noted in detailed exam and in HPI   Focused examination:  Knee Examination (focused):  Skin is intact about the right knee without obvious deformity, erythema or ecchymosis.   RIGHT  AROM  (degrees) PROM (degrees)  0-125 0-130  Palpation (pain): Effusion none   Medial joint line tenderness negative   Lateral joint line tenderness negative  Instability: Varus @ 0 degrees            @ 30 degrees none none   Valgus @ 0 degrees             @ 30 degrees none none  Special Tests: Lachman's Not tested   Posterior drawer none   Anterior drawer none   McMurray negative  Patella: Palpation (pain) Positive-medial facet   Mobility < 2 quadrants   Apprehension negative  Other: Knee flexion strength   5/5   Knee extension strength  5/5   Focal areas of tenderness to palpation: Medial facet of patella with palpable crepitance when ranging from flexion to extension  Vascular/Lymphatic: foot warm and well perfused Neurologic: Sensation intact to light touch to Superficial peroneal/Deep peroneal/Tibial/Sural/Saphenous nerves     XR right knee imaging: X-rays of the right knee including AP, internal and external oblique, lateral were obtained on 11/12/2023 and were reviewed with the patient today.  Per my interpretation, there are no fractures or dislocations.  Very minimal medial joint space narrowing.  Osteophyte noted about the inferior aspect of the patella.    Radiology Read: EXAM: PORTABLE RIGHT KNEE - 1-2 VIEW   COMPARISON:  Feb 03, 2022   FINDINGS: No acute fracture or dislocation. Mild tricompartmental degenerative changes. No area of erosion or osseous destruction. No unexpected radiopaque foreign body. Soft tissues are unremarkable.   IMPRESSION: Mild tricompartmental degenerative changes.  Assessment:  Right knee patellofemoral arthritis  Plan:  Patient was seen and examined in office today. We reviewed patient's history, examination, and imaging in detail. Based on information available for this encounter, patient is likely symptomatic from right knee patellofemoral arthritis.  He describes symptoms of pain exacerbated by going up and down hills, deep  squatting and kneeling.  His exam demonstrates pain over the medial facet of the patella as well as crepitance with knee range of motion.  It is difficult to appreciate on old radiographs the condition of the patellofemoral joint as there is no sunrise view.  We discussed the natural course of patient's disease process and conservative treatment options moving forward.  He has exhausted a number of options to this point.  We discussed the possibility of viscosupplementation injection.  Patient would be interested in pursuing this.  We will check with patient's insurance to see if it is covered.  We have also recommended formal physical therapy to focus on strengthening the muscles around the knee which could aid in symptom relief.  I would like to obtain a sunrise view of the right knee upon patient's next visit to the office.  We we will plan on seeing patient back in 6 weeks after the completion of physical therapy or sooner if approved for viscosupplementation injection.   All questions, concerns and comments were addressed to the best of my ability.  Follow-up: 6 weeks or sooner if approved for viscosupplementation  Radiographs to be obtained at next visit: Right knee  sunrise view   Arlyss GEANNIE Schneider, DO Orthopedic Surgery & Sports Medicine [place]   This document was dictated using Dragon voice recognition software. A reasonable attempt at proof reading has been made to minimize errors.

## 2024-06-15 NOTE — Patient Instructions (Signed)

## 2024-06-27 ENCOUNTER — Ambulatory Visit: Admitting: Physical Therapy

## 2024-06-27 DIAGNOSIS — G8929 Other chronic pain: Secondary | ICD-10-CM | POA: Diagnosis not present

## 2024-06-27 DIAGNOSIS — M25561 Pain in right knee: Secondary | ICD-10-CM | POA: Insufficient documentation

## 2024-06-27 NOTE — Therapy (Signed)
 OUTPATIENT PHYSICAL THERAPY LOWER EXTREMITY EVALUATION   Patient Name: Trevor Boyd MRN: [id number] DOB:Mar 28, 1961, 63 y.o., male Today's Date: 06/27/2024  END OF SESSION:  PT End of Session - 06/27/24 1610     Visit Number 1    Number of Visits 24    Date for Recertification  09/19/24    Authorization Type [id number]    Authorization - Visit Number 1    Authorization - Number of Visits 24    Progress Note Due on Visit 10    PT Start Time 1515    PT Stop Time 1600    PT Time Calculation (min) 45 min    Activity Tolerance Patient tolerated treatment well    Behavior During Therapy WFL for tasks assessed/performed          Past Medical History:  Diagnosis Date   AKI (acute kidney injury) 10/2022   due to taking meloxicam -pcp took pt off med and kidney function went back to normal limits   Depression    DVT (deep venous thrombosis) (HCC) 08/08/2019   Right calf, behind knee, and right thigh   Elevated alkaline phosphatase level    Elevated blood pressure reading without diagnosis of hypertension    Osteoarthritis of right knee    Pre-diabetes    Past Surgical History:  Procedure Laterality Date   COLONOSCOPY  07/20/2013   Dr. Donnice Manes, [facility]; Impression: one 3mm polyp in the rectum. Lymphoid aggregate. Repeat 07/25/2018 Dr. Manes   COLONOSCOPY WITH PROPOFOL  N/A 04/11/2020   Procedure: COLONOSCOPY WITH PROPOFOL ;  Surgeon: Janalyn Keene NOVAK, MD;  Location: [facility] ENDOSCOPY;  Service: Gastroenterology;  Laterality: N/A;   COLONOSCOPY WITH PROPOFOL  N/A 04/02/2022   Procedure: COLONOSCOPY WITH PROPOFOL ;  Surgeon: Unk Corinn Skiff, MD;  Location: [facility] ENDOSCOPY;  Service: Gastroenterology;  Laterality: N/A;   SHOULDER ARTHROSCOPY WITH SUBACROMIAL DECOMPRESSION AND OPEN ROTATOR C Left 04/05/2023   Procedure: Left shoulder arthroscopic rotator cuff repair, biceps tenodesis, distal clavicle excision, subacromial decompression;  Surgeon: Tobie Priest, MD;  Location: [facility] ORS;   Service: Orthopedics;  Laterality: Left;   WRIST SURGERY Left    Patient Active Problem List   Diagnosis Date Noted   Stage 3a chronic kidney disease (HCC) 06/07/2024   Foot pain, right 06/07/2024   Heartburn 06/07/2024   Primary hypertension 07/20/2023   Screening PSA (prostate specific antigen) 07/20/2023   Mixed hyperlipidemia 07/20/2023   Prediabetes 09/08/2022   Colon cancer screening    Knee pain 01/29/2022   Annual physical exam 06/02/2021   Polyp of colon    Arthritis of knee, degenerative 10/08/2015   Situational stress 10/08/2015    PCP: Kelly Cedar FNP    REFERRING PROVIDER: Dr. Arlyss Schneider     REFERRING DIAG: [phone] (ICD-10-CM) - Chronic pain of right knee   THERAPY DIAG:  No diagnosis found.  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 He has had steroid injections in the knee with the most recent in July but only found relief for 4 weeks. He is using a knee brace with patellar stabilizer supports on side and he has found success with this.   PERTINENT HISTORY: Per Dr. Iran note on October 10th 2025   The patient is a 63 y.o. male seen in clinic for right knee pain.  Patient states symptoms presented about 5 years  ago.  He attributes symptoms to having started while working at [employer].  He states his work requires a lot of standing and walking on concrete and it has taken a toll on his right knee.  He does recall having twisted his knee around the time symptoms started as well.  He notes that pain is localized around the kneecap.  He recalls an event where he thinks his kneecap may have dislocated as well.  Pain has been getting worse in the last few years.  He has tried numerous treatment options including Celebrex, Voltaren  gel, 4 corticosteroid injections, Tylenol  and a brace.  His  most recent corticosteroid injection was administered last month and only provided relief for 1 week.  All of these interventions have stopped working as of late.  Associated symptoms include locking, stiffness and subjective instability.  No other complaints today.   Occupation: Psychologist, sport and exercise at [employer] PAIN:  Are you having pain? Yes: NPRS scale: 1/10 sitting, 7/10 (worst)   Pain location: Medial and Lateral borders of patella on right knee    Pain description: Dull   Aggravating factors: Squatting down and standing up from sitting after a long period of time   Relieving factors: Celebrex and voltaren  help    PRECAUTIONS: None  RED FLAGS: None   WEIGHT BEARING RESTRICTIONS: No  FALLS:  Has patient fallen in last 6 months? No  LIVING ENVIRONMENT: Lives with: lives with their family Lives in: House/apartment Stairs: Yes: Internal: 13 steps; on right going up and on left going up and External: 3 steps; on right going up Has following equipment at home: None  OCCUPATION: Works at [employer]     PLOF: Independent  PATIENT GOALS: He wants to avoid surgery and decrease pain.    NEXT MD VISIT: October 30th   OBJECTIVE:  Note: Objective measures were completed at Evaluation unless otherwise noted.  VITALS BP 135/86 HR 70  SpO2 100%   DIAGNOSTIC FINDINGS:   CLINICAL DATA:  855384 Pain 144615   EXAM: PORTABLE RIGHT KNEE - 1-2 VIEW   COMPARISON:  Feb 03, 2022   FINDINGS: No acute fracture or dislocation. Mild tricompartmental degenerative changes. No area of erosion or osseous destruction. No unexpected radiopaque foreign body. Soft tissues are unremarkable.   IMPRESSION: Mild tricompartmental degenerative changes.     Electronically Signed   By: Corean Salter M.D.   On: 11/12/2023 12:29  PATIENT SURVEYS:  KOOS Jr:     COGNITION: Overall cognitive status: Within functional limits for tasks assessed     SENSATION: WFL  EDEMA:   Circumferential: R/L 14.5/14.5  MUSCLE LENGTH: Hamstrings: Not performed   Thomas test: + on RLE - on LLE     POSTURE: rounded shoulders  PALPATION: Medial and lateral surface of right patella     LOWER EXTREMITY ROM:  Active ROM Right eval Left eval  Hip flexion    Hip extension    Hip abduction    Hip adduction    Hip internal rotation    Hip external rotation    Knee flexion 110/115 140  Knee extension    Ankle dorsiflexion    Ankle plantarflexion    Ankle inversion    Ankle eversion     (Blank rows = not tested)  LOWER EXTREMITY MMT:  MMT Right eval Left eval  Hip flexion NT NT   Hip extension 4 4  Hip abduction 4 4  Hip adduction 4- 4-  Hip internal rotation  NT  NT  Hip external rotation NT NT  Knee flexion 4+ 4+  Knee extension 4+ 4+  Ankle dorsiflexion 4 4  Ankle plantarflexion    Ankle inversion    Ankle eversion     (Blank rows = not tested)  FUNCTIONAL TESTS:  Squat: Single Leg Stance R/L 10 sec/8 sec     GAIT: Not assessed                                                                                                                                  TREATMENT DATE:   06/27/24  THEREX   RLE Knee Flexion AAROM 3 x 10   Squats with fat pad impingement 1 x 10   -Pt reports same amount of knee pain with and without the tape.   PATIENT EDUCATION:  Education details: Form and technique for correct performance of exercise and education on use of icing to decrease swelling and relieve pain.   Person educated: Patient Education method: Explanation, Demonstration, Verbal cues, and Handouts Education comprehension: verbalized understanding, returned demonstration, and verbal cues required  HOME EXERCISE PROGRAM: Access Code: [email or web address] URL: [email or web address] Date: 06/27/2024 Prepared by: Toribio Servant  Exercises - Seated Knee Flexion AAROM  - 1 x daily - 7 x weekly - 2 sets - 10 reps - 2 sec   hold  ASSESSMENT:  CLINICAL IMPRESSION: Patient is a 63 y.o. white male who was seen today for physical therapy evaluation and treatment for right knee pain in the setting of chronic right knee patellofemoral arthritis. Fat impingement taping did not improve right knee pain showing that inferior knee swelling is likely not the result of fat pad impingement. He shows deficits that include increased right knee flexion and decreased right knee mobility. 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     OBJECTIVE IMPAIRMENTS: decreased ROM, hypomobility, impaired flexibility, improper body mechanics, and pain.   ACTIVITY LIMITATIONS: carrying, lifting, bending, standing, squatting, and stairs  PARTICIPATION LIMITATIONS: cleaning, laundry, shopping, community activity, occupation, and yard work  PERSONAL FACTORS: Fitness, Time since onset of injury/illness/exacerbation, and 3+ comorbidities: HTN, Depression, and Right knee OA are also affecting patient's functional outcome.   REHAB POTENTIAL: Fair chronicity of issue    CLINICAL DECISION MAKING: Stable/uncomplicated  EVALUATION COMPLEXITY: Low   GOALS: Goals reviewed with patient? No  SHORT TERM GOALS: Target date: 07/11/2024   Patient will demonstrate undestanding of home exercise plan by performing exercises correctly with evidence of good carry over with min to no verbal or tactile cues .   Baseline: nt   Goal status: INITIAL  2.  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  Baseline: Knees go forward beyond toes   Goal status: INITIAL    LONG TERM GOALS: Target date: 09/19/2024   Patient will increase KOOS  JR score by >=14 patients as evidence of a statistically significant improvement in right knee function that is not due to chance or error Baseline:  15/28 (50%) Goal status: INITIAL  2.  Patient will improve right knee mobility to be within 10% of the AROM of left knee for improved right knee function and to return to bending and squatting without being limited by pain and discomfort for his job related tasks inventory groceries.  Baseline: Knee Flex R/L 110/140 Goal status: INITIAL  3.  Patient will decrease right knee pain when performing squat to stand to <5/10 NRPS as evidence of improved right knee function and to return to completing squatting and stooping tasks to complete job related tasks without being limited by pain.  Baseline: 7/10 NRPS in anterior surface of right knee Goal status: INITIAL    PLAN:  PT FREQUENCY: 1-2x/week  PT DURATION: 12 weeks  PLANNED INTERVENTIONS: 97164- PT Re-evaluation, 97750- Physical Performance Testing, 97110-Therapeutic exercises, 97530- Therapeutic activity, [id number]- Neuromuscular re-education, 97535- Self Care, 02859- Manual therapy, [id number]- Gait training, [phone]- Orthotic Initial, [phone]- Aquatic Therapy, [phone]- Electrical stimulation (unattended), [phone]- Electrical stimulation (manual), [id number]- Traction (mechanical), 20560 (1-2 muscles), 20561 (3+ muscles)- Dry Needling, Patient/Family education, Balance training, Stair training, Taping, Joint mobilization, Joint manipulation, Spinal manipulation, Spinal mobilization, Vestibular training, DME instructions, Cryotherapy, and Moist heat  PLAN FOR NEXT SESSION: Education about how to correctly perform squats with knees behind toes and how to improve functional job related tasks with out as much pain in right knee. Patellar mobilizations   Toribio Servant PT, DPT  [facility] [address], [place], [address] Phone: [phone]   Fax:  [phone]

## 2024-06-27 NOTE — Therapy (Signed)
 OUTPATIENT PHYSICAL THERAPY LOWER EXTREMITY EVALUATION   Patient Name: Trevor Boyd MRN: [id number] DOB:09/16/1960, 63 y.o., male Today's Date: 06/27/2024  END OF SESSION:   Past Medical History:  Diagnosis Date   AKI (acute kidney injury) 10/2022   due to taking meloxicam -pcp took pt off med and kidney function went back to normal limits   Depression    DVT (deep venous thrombosis) (HCC) 08/08/2019   Right calf, behind knee, and right thigh   Elevated alkaline phosphatase level    Elevated blood pressure reading without diagnosis of hypertension    Osteoarthritis of right knee    Pre-diabetes    Past Surgical History:  Procedure Laterality Date   COLONOSCOPY  07/20/2013   Dr. Donnice Manes, [facility]; Impression: one 3mm polyp in the rectum. Lymphoid aggregate. Repeat 07/25/2018 Dr. Manes   COLONOSCOPY WITH PROPOFOL  N/A 04/11/2020   Procedure: COLONOSCOPY WITH PROPOFOL ;  Surgeon: Janalyn Keene NOVAK, MD;  Location: [facility] ENDOSCOPY;  Service: Gastroenterology;  Laterality: N/A;   COLONOSCOPY WITH PROPOFOL  N/A 04/02/2022   Procedure: COLONOSCOPY WITH PROPOFOL ;  Surgeon: Unk Corinn Skiff, MD;  Location: [facility] ENDOSCOPY;  Service: Gastroenterology;  Laterality: N/A;   SHOULDER ARTHROSCOPY WITH SUBACROMIAL DECOMPRESSION AND OPEN ROTATOR C Left 04/05/2023   Procedure: Left shoulder arthroscopic rotator cuff repair, biceps tenodesis, distal clavicle excision, subacromial decompression;  Surgeon: Tobie Priest, MD;  Location: [facility] ORS;  Service: Orthopedics;  Laterality: Left;   WRIST SURGERY Left    Patient Active Problem List   Diagnosis Date Noted   Stage 3a chronic kidney disease (HCC) 06/07/2024   Foot pain, right 06/07/2024   Heartburn 06/07/2024   Primary hypertension 07/20/2023   Screening PSA (prostate specific antigen) 07/20/2023   Mixed hyperlipidemia 07/20/2023   Prediabetes 09/08/2022   Colon cancer screening    Knee pain 01/29/2022   Annual physical exam 06/02/2021    Polyp of colon    Arthritis of knee, degenerative 10/08/2015   Situational stress 10/08/2015    PCP: Dr. Kelly Cedar FNP    REFERRING PROVIDER: Dr. Arlyss Schneider    REFERRING DIAG: Right knee Patella Femoral OA  THERAPY DIAG:  No diagnosis found.  Rationale for Evaluation and Treatment: {HABREHAB:27488}  ONSET DATE: ***  SUBJECTIVE:   SUBJECTIVE STATEMENT: ***  PERTINENT HISTORY:  Per Dr. Iran note on     The patient is a 63 y.o. male seen in clinic for right knee pain.  Patient states symptoms presented about 5 years ago.  He attributes symptoms to having started while working at [employer].  He states his work requires a lot of standing and walking on concrete and it has taken a toll on his right knee.  He does recall having twisted his knee around the time symptoms started as well.  He notes that pain is localized around the kneecap.  He recalls an event where he thinks his kneecap may have dislocated as well.  Pain has been getting worse in the last few years.  He has tried numerous treatment options including Celebrex, Voltaren  gel, 4 corticosteroid injections, Tylenol  and a brace.  His most recent corticosteroid injection was administered last month and only provided relief for 1 week.  All of these interventions have stopped working as of late.  Associated symptoms include locking, stiffness and subjective instability.  No other complaints today.   Occupation: Psychologist, sport and exercise at [employer] PAIN:  Are you having pain? {OPRCPAIN:27236}  PRECAUTIONS: {Therapy precautions:24002}  RED FLAGS: {PT Red Flags:29287}   WEIGHT  BEARING RESTRICTIONS: {Yes ***/No:24003}  FALLS:  Has patient fallen in last 6 months? {fallsyesno:27318}  LIVING ENVIRONMENT: Lives with: {OPRC lives with:25569::lives with their family} Lives in: {Lives in:25570} Stairs: {opstairs:27293} Has following equipment at home: {Assistive devices:23999}  OCCUPATION: ***  PLOF:  {PLOF:24004}  PATIENT GOALS: ***  NEXT MD VISIT: ***  OBJECTIVE:  Note: Objective measures were completed at Evaluation unless otherwise noted.  DIAGNOSTIC FINDINGS: ***  PATIENT SURVEYS:  {rehab surveys:24030}  COGNITION: Overall cognitive status: {cognition:24006}     SENSATION: {sensation:27233}  EDEMA:  {edema:24020}  MUSCLE LENGTH: Hamstrings: Right *** deg; Left *** deg Debby test: Right *** deg; Left *** deg  POSTURE: {posture:25561}  PALPATION: ***  LOWER EXTREMITY ROM:  {AROM/PROM:27142} ROM Right eval Left eval  Hip flexion    Hip extension    Hip abduction    Hip adduction    Hip internal rotation    Hip external rotation    Knee flexion    Knee extension    Ankle dorsiflexion    Ankle plantarflexion    Ankle inversion    Ankle eversion     (Blank rows = not tested)  LOWER EXTREMITY MMT:  MMT Right eval Left eval  Hip flexion    Hip extension    Hip abduction    Hip adduction    Hip internal rotation    Hip external rotation    Knee flexion    Knee extension    Ankle dorsiflexion    Ankle plantarflexion    Ankle inversion    Ankle eversion     (Blank rows = not tested)  LOWER EXTREMITY SPECIAL TESTS:  {LEspecialtests:26242}  FUNCTIONAL TESTS:  {Functional tests:24029}  GAIT: Distance walked: *** Assistive device utilized: {Assistive devices:23999} Level of assistance: {Levels of assistance:24026} Comments: ***                                                                                                                                TREATMENT DATE: ***    PATIENT EDUCATION:  Education details: *** Person educated: {Person educated:25204} Education method: {Education Method:25205} Education comprehension: {Education Comprehension:25206}  HOME EXERCISE PROGRAM: ***  ASSESSMENT:  CLINICAL IMPRESSION: Patient is a *** y.o. *** who was seen today for physical therapy evaluation and treatment for ***.    OBJECTIVE IMPAIRMENTS: {opptimpairments:25111}.   ACTIVITY LIMITATIONS: {activitylimitations:27494}  PARTICIPATION LIMITATIONS: {participationrestrictions:25113}  PERSONAL FACTORS: {Personal factors:25162} are also affecting patient's functional outcome.   REHAB POTENTIAL: {rehabpotential:25112}  CLINICAL DECISION MAKING: {clinical decision making:25114}  EVALUATION COMPLEXITY: {Evaluation complexity:25115}   GOALS: Goals reviewed with patient? {yes/no:20286}  SHORT TERM GOALS: Target date: *** *** Baseline: Goal status: INITIAL  2.  *** Baseline:  Goal status: INITIAL  3.  *** Baseline:  Goal status: INITIAL  4.  *** Baseline:  Goal status: INITIAL  5.  *** Baseline:  Goal status: INITIAL  6.  *** Baseline:  Goal status: INITIAL  LONG TERM GOALS: Target  date: ***  *** Baseline:  Goal status: INITIAL  2.  *** Baseline:  Goal status: INITIAL  3.  *** Baseline:  Goal status: INITIAL  4.  *** Baseline:  Goal status: INITIAL  5.  *** Baseline:  Goal status: INITIAL  6.  *** Baseline:  Goal status: INITIAL   PLAN:  PT FREQUENCY: {rehab frequency:25116}  PT DURATION: {rehab duration:25117}  PLANNED INTERVENTIONS: {rehab planned interventions:25118::97110-Therapeutic exercises,97530- Therapeutic [phone]- Neuromuscular re-education,97535- Self Rjmz,02859- Manual therapy,Patient/Family education}  PLAN FOR NEXT SESSION: ***   Toribio JINNY Servant, PT 06/27/2024, 11:23 AM

## 2024-07-02 ENCOUNTER — Encounter: Payer: Self-pay | Admitting: Physical Therapy

## 2024-07-02 ENCOUNTER — Ambulatory Visit: Admitting: Physical Therapy

## 2024-07-02 DIAGNOSIS — G8929 Other chronic pain: Secondary | ICD-10-CM | POA: Diagnosis not present

## 2024-07-02 DIAGNOSIS — M25561 Pain in right knee: Secondary | ICD-10-CM | POA: Diagnosis not present

## 2024-07-02 NOTE — Therapy (Signed)
 OUTPATIENT PHYSICAL THERAPY LOWER EXTREMITY EVALUATION   Patient Name: Trevor Boyd MRN: [id number] DOB:[birth year], 63 y.o., male Today's Date: 07/02/2024  END OF SESSION:  PT End of Session - 07/02/24 1725     Visit Number 2    Number of Visits 24    Date for Recertification  09/19/24    Authorization Type [id number]    Authorization - Visit Number 2    Authorization - Number of Visits 24    Progress Note Due on Visit 10    PT Start Time 1725    PT Stop Time 1810    PT Time Calculation (min) 45 min    Activity Tolerance Patient tolerated treatment well    Behavior During Therapy WFL for tasks assessed/performed          Past Medical History:  Diagnosis Date   AKI (acute kidney injury) 10/2022   due to taking meloxicam -pcp took pt off med and kidney function went back to normal limits   Depression    DVT (deep venous thrombosis) (HCC) 08/08/2019   Right calf, behind knee, and right thigh   Elevated alkaline phosphatase level    Elevated blood pressure reading without diagnosis of hypertension    Osteoarthritis of right knee    Pre-diabetes    Past Surgical History:  Procedure Laterality Date   COLONOSCOPY  07/20/2013   Dr. Donnice Manes, [facility]; Impression: one 3mm polyp in the rectum. Lymphoid aggregate. Repeat 07/25/2018 Dr. Manes   COLONOSCOPY WITH PROPOFOL  N/A 04/11/2020   Procedure: COLONOSCOPY WITH PROPOFOL ;  Surgeon: Janalyn Keene NOVAK, MD;  Location: [facility] ENDOSCOPY;  Service: Gastroenterology;  Laterality: N/A;   COLONOSCOPY WITH PROPOFOL  N/A 04/02/2022   Procedure: COLONOSCOPY WITH PROPOFOL ;  Surgeon: Unk Corinn Skiff, MD;  Location: [facility] ENDOSCOPY;  Service: Gastroenterology;  Laterality: N/A;   SHOULDER ARTHROSCOPY WITH SUBACROMIAL DECOMPRESSION AND OPEN ROTATOR C Left 04/05/2023   Procedure: Left shoulder arthroscopic rotator cuff repair, biceps tenodesis, distal clavicle excision, subacromial decompression;  Surgeon: Tobie Priest, MD;  Location: [facility] ORS;   Service: Orthopedics;  Laterality: Left;   WRIST SURGERY Left    Patient Active Problem List   Diagnosis Date Noted   Stage 3a chronic kidney disease (HCC) 06/07/2024   Foot pain, right 06/07/2024   Heartburn 06/07/2024   Primary hypertension 07/20/2023   Screening PSA (prostate specific antigen) 07/20/2023   Mixed hyperlipidemia 07/20/2023   Prediabetes 09/08/2022   Colon cancer screening    Knee pain 01/29/2022   Annual physical exam 06/02/2021   Polyp of colon    Arthritis of knee, degenerative 10/08/2015   Situational stress 10/08/2015    PCP: Kelly Cedar FNP    REFERRING PROVIDER: Dr. 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 He has had steroid injections in the knee with the most recent in July but only found relief for 4 weeks. He is using a knee brace with patellar stabilizer supports on side and he has found success with this.   PERTINENT HISTORY: Per Dr. Iran note on October 10th 2025   The patient is a 63 y.o. male seen in clinic for right knee pain.  Patient states symptoms presented about  5 years ago.  He attributes symptoms to having started while working at [employer].  He states his work requires a lot of standing and walking on concrete and it has taken a toll on his right knee.  He does recall having twisted his knee around the time symptoms started as well.  He notes that pain is localized around the kneecap.  He recalls an event where he thinks his kneecap may have dislocated as well.  Pain has been getting worse in the last few years.  He has tried numerous treatment options including Celebrex, Voltaren  gel, 4 corticosteroid injections, Tylenol  and a brace.   His most recent corticosteroid injection was administered last month and only provided relief for 1 week.  All of these interventions have stopped working as of late.  Associated symptoms include locking, stiffness and subjective instability.  No other complaints today.   Occupation: Psychologist, sport and exercise at [employer] PAIN:  Are you having pain? Yes: NPRS scale: 1/10 sitting, 7/10 (worst)   Pain location: Medial and Lateral borders of patella on right knee    Pain description: Dull   Aggravating factors: Squatting down and standing up from sitting after a long period of time   Relieving factors: Celebrex and voltaren  help    PRECAUTIONS: None  RED FLAGS: None   WEIGHT BEARING RESTRICTIONS: No  FALLS:  Has patient fallen in last 6 months? No  LIVING ENVIRONMENT: Lives with: lives with their family Lives in: House/apartment Stairs: Yes: Internal: 13 steps; on right going up and on left going up and External: 3 steps; on right going up Has following equipment at home: None  OCCUPATION: Works at [employer]     PLOF: Independent  PATIENT GOALS: He wants to avoid surgery and decrease pain.    NEXT MD VISIT: October 30th   OBJECTIVE:  Note: Objective measures were completed at Evaluation unless otherwise noted.  VITALS BP 135/86 HR 70  SpO2 100%   DIAGNOSTIC FINDINGS:   CLINICAL DATA:  855384 Pain 144615   EXAM: PORTABLE RIGHT KNEE - 1-2 VIEW   COMPARISON:  Feb 03, 2022   FINDINGS: No acute fracture or dislocation. Mild tricompartmental degenerative changes. No area of erosion or osseous destruction. No unexpected radiopaque foreign body. Soft tissues are unremarkable.   IMPRESSION: Mild tricompartmental degenerative changes.     Electronically Signed   By: Corean Salter M.D.   On: 11/12/2023 12:29  PATIENT SURVEYS:  KOOS Jr:     COGNITION: Overall cognitive status: Within functional limits for tasks  assessed     SENSATION: WFL  EDEMA:  Circumferential: R/L 14.5/14.5  MUSCLE LENGTH: Hamstrings: Not performed   Thomas test: + on RLE - on LLE     POSTURE: rounded shoulders  PALPATION: Medial and lateral surface of right patella     LOWER EXTREMITY ROM:  Active ROM Right eval Left eval  Hip flexion    Hip extension    Hip abduction    Hip adduction    Hip internal rotation    Hip external rotation    Knee flexion 110/115 140  Knee extension    Ankle dorsiflexion    Ankle plantarflexion    Ankle inversion    Ankle eversion     (Blank rows = not tested)  LOWER EXTREMITY MMT:  MMT Right eval Left eval  Hip flexion NT NT   Hip extension 4 4  Hip abduction 4 4  Hip adduction 4- 4-  Hip  internal rotation NT  NT  Hip external rotation NT NT  Knee flexion 4+ 4+  Knee extension 4+ 4+  Ankle dorsiflexion 4 4  Ankle plantarflexion    Ankle inversion    Ankle eversion     (Blank rows = not tested)  FUNCTIONAL TESTS:  Squat: Single Leg Stance R/L 10 sec/8 sec     GAIT: Not assessed                                                                                                                                  TREATMENT DATE:   07/02/24: THEREX  Matrix  Recumbent bike at 20 5 min Single Leg Stance R/L 10 sec/ 8 sec Standing Hip Abduction with BUE support 2 x 10  Standing Hip Abduction with 1 UE support 2 X 10 Standing Hip Abduction with BUE support with red band 2 x 10  -min VC to stop externally rotating toes   THERAC   Squat with 24 inch height  2 x 10   -min VC to keep knees behind toes Wall Sits to 80 degs 2 x 10   -min VC to keep knees behind toes   MANUAL: on Right knee and towel propped behind right knee.  PFJ Cranial Glide Grade III 2 x 20  PFJ Caudal Glide Grade III 2 x 20   Education on self mobilization  for PFJ caudal glide 2 x 10      06/27/24  THEREX   RLE Knee Flexion AAROM 3 x 10   Squats with fat pad impingement 1 x 10   -Pt  reports same amount of knee pain with and without the tape.   PATIENT EDUCATION:  Education details: Form and technique for correct performance of exercise and education on use of icing to decrease swelling and relieve pain.   Person educated: Patient Education method: Explanation, Demonstration, Verbal cues, and Handouts Education comprehension: verbalized understanding, returned demonstration, and verbal cues required  HOME EXERCISE PROGRAM: Access Code: [email or web address] URL: [email or web address] Date: 07/02/2024 Prepared by: Toribio Servant  Exercises - Seated Knee Flexion AAROM  - 1 x daily - 7 x weekly - 2 sets - 10 reps - 2 sec  hold - Wall Squat  - 3-4 x weekly - 2 sets - 10 reps - 5 sec hold - Standing Hip Abduction With Chair: Alternating With Resistance Loop  - 3-4 x weekly - 3 sets - 10 reps - Long Sitting Inferior Patellar Glide  - 1 x daily - 7 x weekly - 2 sets - 10 reps   ASSESSMENT:  CLINICAL IMPRESSION: Pt presents for follow for right knee pain in the setting of patellofemoral arthritis. Meniscal pathology ruled out with no joint line tenderness and negative McMurray's. Pt does exhibit hypomobility in right knee patellofemoral joint as evidenced by restriction with knee mobilizations and improvement in knee pain after patellofemoral caudal glides. PT focused on manual therapy to  improve mobility of patella femoral joint and strengthening to improve knee stability to offload patellofemoral joint. PT also focused on body mechanics with sit to stand to decrease abnormal load on knee by preventing anterior knee translation during squat. 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      OBJECTIVE IMPAIRMENTS: decreased ROM, hypomobility, impaired flexibility, improper body mechanics, and pain.   ACTIVITY LIMITATIONS: carrying,  lifting, bending, standing, squatting, and stairs  PARTICIPATION LIMITATIONS: cleaning, laundry, shopping, community activity, occupation, and yard work  PERSONAL FACTORS: Fitness, Time since onset of injury/illness/exacerbation, and 3+ comorbidities: HTN, Depression, and Right knee OA are also affecting patient's functional outcome.   REHAB POTENTIAL: Fair chronicity of issue    CLINICAL DECISION MAKING: Stable/uncomplicated  EVALUATION COMPLEXITY: Low   GOALS: Goals reviewed with patient? No  SHORT TERM GOALS: Target date: 07/11/2024   Patient will demonstrate undestanding of home exercise plan by performing exercises correctly with evidence of good carry over with min to no verbal or tactile cues .   Baseline: nt  07/02/24: Performing independently    Goal status: ACHIEVED   2.  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  Baseline: Knees go forward beyond toes   Goal status: ONGOING     LONG TERM GOALS: Target date: 09/19/2024   Patient will increase KOOS JR score by >=14 patients as evidence of a statistically significant improvement in right knee function that is not due to chance or error Baseline:  15/28 (50%) Goal status: ONGOING     2.  Patient will improve right knee mobility to be within 10% of the AROM of left knee for improved right knee function and to return to bending and squatting without being limited by pain and discomfort for his job related tasks inventory groceries.  Baseline: Knee Flex R/L 110/140 Goal status: INITIAL  3.  Patient will decrease right knee pain when performing squat to stand to <5/10 NRPS as evidence of improved right knee function and to return to completing squatting and stooping tasks to complete job related tasks without being limited by  pain.  Baseline: 7/10 NRPS in anterior surface of right knee Goal status: ONGOING       PLAN:  PT FREQUENCY: 1-2x/week  PT DURATION: 12 weeks  PLANNED INTERVENTIONS: 97164- PT Re-evaluation, 97750- Physical Performance Testing, 97110-Therapeutic exercises, 97530- Therapeutic activity, [id number]- Neuromuscular re-education, 97535- Self Care, 02859- Manual therapy, [id number]- Gait training, [phone]- Orthotic Initial, [phone]- Aquatic Therapy, [phone]- Electrical stimulation (unattended), [phone]- Electrical stimulation (manual), [id number]- Traction (mechanical), 20560 (1-2 muscles), 20561 (3+ muscles)- Dry Needling, Patient/Family education, Balance training, Stair training, Taping, Joint mobilization, Joint manipulation, Spinal manipulation, Spinal mobilization, Vestibular training, DME instructions, Cryotherapy, and Moist heat  PLAN FOR NEXT SESSION: Continue with patellofemoral glides and ongoing strengthening of right knee with static lunges.  Toribio Servant PT, DPT  [facility] [address], [place], [address] Phone: [phone]   Fax:  [phone]

## 2024-07-04 ENCOUNTER — Encounter: Payer: Self-pay | Admitting: Physical Therapy

## 2024-07-04 ENCOUNTER — Ambulatory Visit

## 2024-07-04 DIAGNOSIS — M25561 Pain in right knee: Secondary | ICD-10-CM | POA: Diagnosis not present

## 2024-07-04 DIAGNOSIS — G8929 Other chronic pain: Secondary | ICD-10-CM | POA: Diagnosis not present

## 2024-07-04 NOTE — Therapy (Signed)
 OUTPATIENT PHYSICAL THERAPY LOWER EXTREMITY TREATMENT   Patient Name: HAEDEN HUDOCK MRN: [id number] DOB:1961-07-11, 63 y.o., male Today's Date: 07/04/2024  END OF SESSION:  PT End of Session - 07/04/24 0943     Visit Number 3    Number of Visits 24    Date for Recertification  09/19/24    Authorization Type [id number]    Authorization - Visit Number 3    Authorization - Number of Visits 24    Progress Note Due on Visit 10    PT Start Time 0945    PT Stop Time 1027    PT Time Calculation (min) 42 min    Activity Tolerance Patient tolerated treatment well    Behavior During Therapy WFL for tasks assessed/performed          Past Medical History:  Diagnosis Date   AKI (acute kidney injury) 10/2022   due to taking meloxicam -pcp took pt off med and kidney function went back to normal limits   Depression    DVT (deep venous thrombosis) (HCC) 08/08/2019   Right calf, behind knee, and right thigh   Elevated alkaline phosphatase level    Elevated blood pressure reading without diagnosis of hypertension    Osteoarthritis of right knee    Pre-diabetes    Past Surgical History:  Procedure Laterality Date   COLONOSCOPY  07/20/2013   Dr. Donnice Manes, [facility]; Impression: one 3mm polyp in the rectum. Lymphoid aggregate. Repeat 07/25/2018 Dr. Manes   COLONOSCOPY WITH PROPOFOL  N/A 04/11/2020   Procedure: COLONOSCOPY WITH PROPOFOL ;  Surgeon: Janalyn Keene NOVAK, MD;  Location: [facility] ENDOSCOPY;  Service: Gastroenterology;  Laterality: N/A;   COLONOSCOPY WITH PROPOFOL  N/A 04/02/2022   Procedure: COLONOSCOPY WITH PROPOFOL ;  Surgeon: Unk Corinn Skiff, MD;  Location: [facility] ENDOSCOPY;  Service: Gastroenterology;  Laterality: N/A;   SHOULDER ARTHROSCOPY WITH SUBACROMIAL DECOMPRESSION AND OPEN ROTATOR C Left 04/05/2023   Procedure: Left shoulder arthroscopic rotator cuff repair, biceps tenodesis, distal clavicle excision, subacromial decompression;  Surgeon: Tobie Priest, MD;  Location: [facility] ORS;   Service: Orthopedics;  Laterality: Left;   WRIST SURGERY Left    Patient Active Problem List   Diagnosis Date Noted   Stage 3a chronic kidney disease (HCC) 06/07/2024   Foot pain, right 06/07/2024   Heartburn 06/07/2024   Primary hypertension 07/20/2023   Screening PSA (prostate specific antigen) 07/20/2023   Mixed hyperlipidemia 07/20/2023   Prediabetes 09/08/2022   Colon cancer screening    Knee pain 01/29/2022   Annual physical exam 06/02/2021   Polyp of colon    Arthritis of knee, degenerative 10/08/2015   Situational stress 10/08/2015    PCP: Kelly Cedar FNP    REFERRING PROVIDER: Dr. Arlyss Schneider     REFERRING DIAG: [phone] (ICD-10-CM) - Chronic pain of right knee   THERAPY DIAG:  Chronic pain of right knee  Rationale for Evaluation and Treatment: Rehabilitation  ONSET DATE: >1 year ago    SUBJECTIVE:   SUBJECTIVE STATEMENT: Patient continues to have difficulty getting   PERTINENT HISTORY: Per Dr. Iran note on October 10th 2025   The patient is a 63 y.o. male seen in clinic for right knee pain.  Patient states symptoms presented about 5 years ago.  He attributes symptoms to having started while working at [employer].  He states his work requires a lot of standing and walking on concrete and it has taken a toll on his right knee.  He does recall having twisted his knee around the time  symptoms started as well.  He notes that pain is localized around the kneecap.  He recalls an event where he thinks his kneecap may have dislocated as well.  Pain has been getting worse in the last few years.  He has tried numerous treatment options including Celebrex, Voltaren  gel, 4 corticosteroid injections, Tylenol  and a brace.  His most recent corticosteroid injection was administered last month and only provided relief for 1 week.  All of these interventions have stopped working as of late.  Associated symptoms include locking, stiffness and subjective instability.  No other  complaints today.   Occupation: Psychologist, sport and exercise at [employer] PAIN:  Are you having pain? Yes: NPRS scale: 1/10 sitting, 7/10 (worst)   Pain location: Medial and Lateral borders of patella on right knee    Pain description: Dull   Aggravating factors: Squatting down and standing up from sitting after a long period of time   Relieving factors: Celebrex and voltaren  help    PRECAUTIONS: None  RED FLAGS: None   WEIGHT BEARING RESTRICTIONS: No  FALLS:  Has patient fallen in last 6 months? No  LIVING ENVIRONMENT: Lives with: lives with their family Lives in: House/apartment Stairs: Yes: Internal: 13 steps; on right going up and on left going up and External: 3 steps; on right going up Has following equipment at home: None  OCCUPATION: Works at [employer]     PLOF: Independent  PATIENT GOALS: He wants to avoid surgery and decrease pain.    NEXT MD VISIT: October 30th   OBJECTIVE:  Note: Objective measures were completed at Evaluation unless otherwise noted.  VITALS BP 135/86 HR 70  SpO2 100%   DIAGNOSTIC FINDINGS:   CLINICAL DATA:  855384 Pain 144615   EXAM: PORTABLE RIGHT KNEE - 1-2 VIEW   COMPARISON:  Feb 03, 2022   FINDINGS: No acute fracture or dislocation. Mild tricompartmental degenerative changes. No area of erosion or osseous destruction. No unexpected radiopaque foreign body. Soft tissues are unremarkable.   IMPRESSION: Mild tricompartmental degenerative changes.     Electronically Signed   By: Corean Salter M.D.   On: 11/12/2023 12:29  PATIENT SURVEYS:  KOOS Jr:     COGNITION: Overall cognitive status: Within functional limits for tasks assessed     SENSATION: WFL  EDEMA:  Circumferential: R/L 14.5/14.5  MUSCLE LENGTH: Hamstrings: Not performed   Thomas test: + on RLE - on LLE     POSTURE: rounded shoulders  PALPATION: Medial and lateral surface of right patella     LOWER EXTREMITY ROM:  Active ROM  Right eval Left eval  Hip flexion    Hip extension    Hip abduction    Hip adduction    Hip internal rotation    Hip external rotation    Knee flexion 110/115 140  Knee extension    Ankle dorsiflexion    Ankle plantarflexion    Ankle inversion    Ankle eversion     (Blank rows = not tested)  LOWER EXTREMITY MMT:  MMT Right eval Left eval  Hip flexion NT NT   Hip extension 4 4  Hip abduction 4 4  Hip adduction 4- 4-  Hip internal rotation NT  NT  Hip external rotation NT NT  Knee flexion 4+ 4+  Knee extension 4+ 4+  Ankle dorsiflexion 4 4  Ankle plantarflexion    Ankle inversion    Ankle eversion     (Blank rows = not tested)  FUNCTIONAL  TESTS:  Squat: Single Leg Stance R/L 10 sec/8 sec     GAIT: Not assessed                                                                                                                                  TREATMENT DATE:   07/04/24:  Matrix recumbent bike x 5 minutes level 4-2 to improve cardiorespiratory endurance and BLE strengthening  TRX squats 3 x 10 Fwd step downs 6 step x 10 each LE with single UE support - easy Static lunge with single UE support 3 x 10 each LE   Therex Leg press at OMEGA 3 x 10 - 75# Hip flexion at Matrix 3 x 10 - 70# on L LE, 55# on R LE  Hip abduction at Matrix 3 x 10 - 55# each LE  Hip extension at Matrix 3 x 10 - 70# each LE    07/02/24: THEREX  Matrix  Recumbent bike at 20 5 min Single Leg Stance R/L 10 sec/ 8 sec Standing Hip Abduction with BUE support 2 x 10  Standing Hip Abduction with 1 UE support 2 X 10 Standing Hip Abduction with BUE support with red band 2 x 10  -min VC to stop externally rotating toes   THERAC   Squat with 24 inch height  2 x 10   -min VC to keep knees behind toes Wall Sits to 80 degs 2 x 10   -min VC to keep knees behind toes   MANUAL: on Right knee and towel propped behind right knee.  PFJ Cranial Glide Grade III 2 x 20  PFJ Caudal Glide Grade III 2 x 20    Education on self mobilization  for PFJ caudal glide 2 x 10      06/27/24  THEREX   RLE Knee Flexion AAROM 3 x 10   Squats with fat pad impingement 1 x 10   -Pt reports same amount of knee pain with and without the tape.   PATIENT EDUCATION:  Education details: Form and technique for correct performance of exercise and education on use of icing to decrease swelling and relieve pain.   Person educated: Patient Education method: Explanation, Demonstration, Verbal cues, and Handouts Education comprehension: verbalized understanding, returned demonstration, and verbal cues required  HOME EXERCISE PROGRAM: Access Code: [email or web address] URL: [email or web address] Date: 07/02/2024 Prepared by: Toribio Servant  Exercises - Seated Knee Flexion AAROM  - 1 x daily - 7 x weekly - 2 sets - 10 reps - 2 sec  hold - Wall Squat  - 3-4 x weekly - 2 sets - 10 reps - 5 sec hold - Standing Hip Abduction With Chair: Alternating With Resistance Loop  - 3-4 x weekly - 3 sets - 10 reps - Long Sitting Inferior Patellar Glide  - 1 x daily - 7 x weekly - 2 sets - 10 reps   ASSESSMENT:  CLINICAL IMPRESSION:  Continued PT  POC focused on R knee pain. Session focused on B hip and knee strengthening with increased resistance. Tolerated session well with no pain at end of session. Patient appreciative of increase in intensity and feels this is what he needs as his knee feels better at end of session. 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      OBJECTIVE IMPAIRMENTS: decreased ROM, hypomobility, impaired flexibility, improper body mechanics, and pain.   ACTIVITY LIMITATIONS: carrying, lifting, bending, standing, squatting, and stairs  PARTICIPATION LIMITATIONS: cleaning, laundry, shopping, community activity, occupation, and yard work  PERSONAL FACTORS:  Fitness, Time since onset of injury/illness/exacerbation, and 3+ comorbidities: HTN, Depression, and Right knee OA are also affecting patient's functional outcome.   REHAB POTENTIAL: Fair chronicity of issue    CLINICAL DECISION MAKING: Stable/uncomplicated  EVALUATION COMPLEXITY: Low   GOALS: Goals reviewed with patient? No  SHORT TERM GOALS: Target date: 07/11/2024   Patient will demonstrate undestanding of home exercise plan by performing exercises correctly with evidence of good carry over with min to no verbal or tactile cues .   Baseline: nt  07/02/24: Performing independently    Goal status: ACHIEVED   2.  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  Baseline: Knees go forward beyond toes   Goal status: ONGOING     LONG TERM GOALS: Target date: 09/19/2024   Patient will increase KOOS JR score by >=14 patients as evidence of a statistically significant improvement in right knee function that is not due to chance or error Baseline:  15/28 (50%) Goal status: ONGOING     2.  Patient will improve right knee mobility to be within 10% of the AROM of left knee for improved right knee function and to return to bending and squatting without being limited by pain and discomfort for his job related tasks inventory groceries.  Baseline: Knee Flex R/L 110/140 Goal status: INITIAL  3.  Patient will decrease right knee pain when performing squat to stand to <5/10 NRPS as evidence of improved right knee function and to return to completing squatting and stooping tasks to complete job related tasks without being limited by pain.  Baseline: 7/10 NRPS in anterior surface of right knee Goal status: ONGOING       PLAN:  PT FREQUENCY: 1-2x/week  PT DURATION: 12 weeks  PLANNED INTERVENTIONS:  97164- PT Re-evaluation, 97750- Physical Performance Testing, 97110-Therapeutic exercises, 97530- Therapeutic activity, [id number]- Neuromuscular re-education, 97535- Self Care, 02859- Manual therapy, [id number]- Gait training, [phone]- Orthotic Initial, [phone]- Aquatic Therapy, [phone]- Electrical stimulation (unattended), [phone]- Electrical stimulation (manual), [id number]- Traction (mechanical), 20560 (1-2 muscles), 20561 (3+ muscles)- Dry Needling, Patient/Family education, Balance training, Stair training, Taping, Joint mobilization, Joint manipulation, Spinal manipulation, Spinal mobilization, Vestibular training, DME instructions, Cryotherapy, and Moist heat  PLAN FOR NEXT SESSION: Continue with patellofemoral glides and ongoing strengthening of right knee with static lunges.   Maryanne Finder, PT, DPT Physical Therapist - [place]  [facility]

## 2024-07-05 ENCOUNTER — Encounter: Payer: Self-pay | Admitting: Physician Assistant

## 2024-07-05 ENCOUNTER — Ambulatory Visit (INDEPENDENT_AMBULATORY_CARE_PROVIDER_SITE_OTHER): Admitting: Physician Assistant

## 2024-07-05 VITALS — BP 126/87 | HR 73 | Temp 98.4°F | Ht 73.0 in | Wt 216.5 lb

## 2024-07-05 DIAGNOSIS — N1831 Chronic kidney disease, stage 3a: Secondary | ICD-10-CM

## 2024-07-05 DIAGNOSIS — G8929 Other chronic pain: Secondary | ICD-10-CM

## 2024-07-05 DIAGNOSIS — M25561 Pain in right knee: Secondary | ICD-10-CM | POA: Diagnosis not present

## 2024-07-05 DIAGNOSIS — R748 Abnormal levels of other serum enzymes: Secondary | ICD-10-CM

## 2024-07-05 DIAGNOSIS — E782 Mixed hyperlipidemia: Secondary | ICD-10-CM

## 2024-07-05 DIAGNOSIS — F439 Reaction to severe stress, unspecified: Secondary | ICD-10-CM | POA: Diagnosis not present

## 2024-07-05 DIAGNOSIS — R12 Heartburn: Secondary | ICD-10-CM

## 2024-07-05 MED ORDER — SERTRALINE HCL 100 MG PO TABS: 100.0000 mg | ORAL_TABLET | Freq: Every day | ORAL | 3 refills | Status: AC
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
## 2024-07-09 ENCOUNTER — Encounter: Payer: Self-pay | Admitting: Radiology

## 2024-07-10 ENCOUNTER — Ambulatory Visit: Admitting: Physical Therapy

## 2024-07-16 NOTE — Therapy (Signed)
 OUTPATIENT PHYSICAL THERAPY LOWER EXTREMITY TREATMENT   Patient Name: Trevor Boyd MRN: [id number] DOB:[birth year], 63 y.o., male Today's Date: 07/17/2024  END OF SESSION:  PT End of Session - 07/17/24 0808     Visit Number 4    Number of Visits 24    Date for Recertification  09/19/24    Authorization Type [id number]    Authorization - Number of Visits 24    Progress Note Due on Visit 10    PT Start Time 0815    PT Stop Time 0856    PT Time Calculation (min) 41 min    Activity Tolerance Patient tolerated treatment well    Behavior During Therapy [facility] for tasks assessed/performed           Past Medical History:  Diagnosis Date   AKI (acute kidney injury) 10/2022   due to taking meloxicam -pcp took pt off med and kidney function went back to normal limits   Depression    DVT (deep venous thrombosis) (HCC) 08/08/2019   Right calf, behind knee, and right thigh   Elevated alkaline phosphatase level    Elevated blood pressure reading without diagnosis of hypertension    Osteoarthritis of right knee    Pre-diabetes    Past Surgical History:  Procedure Laterality Date   COLONOSCOPY  07/20/2013   Dr. Donnice Manes, [facility]; Impression: one 3mm polyp in the rectum. Lymphoid aggregate. Repeat 07/25/2018 Dr. Manes   COLONOSCOPY WITH PROPOFOL  N/A 04/11/2020   Procedure: COLONOSCOPY WITH PROPOFOL ;  Surgeon: Janalyn Keene NOVAK, MD;  Location: [facility] ENDOSCOPY;  Service: Gastroenterology;  Laterality: N/A;   COLONOSCOPY WITH PROPOFOL  N/A 04/02/2022   Procedure: COLONOSCOPY WITH PROPOFOL ;  Surgeon: Unk Corinn Skiff, MD;  Location: [facility] ENDOSCOPY;  Service: Gastroenterology;  Laterality: N/A;   SHOULDER ARTHROSCOPY WITH SUBACROMIAL DECOMPRESSION AND OPEN ROTATOR C Left 04/05/2023   Procedure: Left shoulder arthroscopic rotator cuff repair, biceps tenodesis, distal clavicle excision, subacromial decompression;  Surgeon: Tobie Priest, MD;  Location: [facility] ORS;  Service: Orthopedics;   Laterality: Left;   WRIST SURGERY Left    Patient Active Problem List   Diagnosis Date Noted   Stage 3a chronic kidney disease (HCC) 06/07/2024   Foot pain, right 06/07/2024   Heartburn 06/07/2024   Primary hypertension 07/20/2023   Screening PSA (prostate specific antigen) 07/20/2023   Mixed hyperlipidemia 07/20/2023   Prediabetes 09/08/2022   Colon cancer screening    Knee pain 01/29/2022   Annual physical exam 06/02/2021   Polyp of colon    Arthritis of knee, degenerative 10/08/2015   Situational stress 10/08/2015    PCP: Kelly Cedar FNP    REFERRING PROVIDER: Dr. Arlyss Schneider     REFERRING DIAG: [id number] (ICD-10-CM) - Chronic pain of right knee   THERAPY DIAG:  Chronic pain of right knee  Rationale for Evaluation and Treatment: Rehabilitation  ONSET DATE: >1 year ago    SUBJECTIVE:   SUBJECTIVE STATEMENT:   Patient reports 7/10 pain in his R knee. Currently wearing his R knee brace. He had to refill his Celebrex.   PERTINENT HISTORY: Per Dr. Iran note on October 10th 2025   The patient is a 63 y.o. male seen in clinic for right knee pain.  Patient states symptoms presented about 5 years ago.  He attributes symptoms to having started while working at [employer].  He states his work requires a lot of standing and walking on concrete and it has taken a toll on his right knee.  He  does recall having twisted his knee around the time symptoms started as well.  He notes that pain is localized around the kneecap.  He recalls an event where he thinks his kneecap may have dislocated as well.  Pain has been getting worse in the last few years.  He has tried numerous treatment options including Celebrex, Voltaren  gel, 4 corticosteroid injections, Tylenol  and a brace.  His most recent corticosteroid injection was administered last month and only provided relief for 1 week.  All of these interventions have stopped working as of late.  Associated symptoms include locking,  stiffness and subjective instability.  No other complaints today.   Occupation: Psychologist, sport and exercise at [employer] PAIN:  Are you having pain? Yes: NPRS scale: 1/10 sitting, 7/10 (worst)   Pain location: Medial and Lateral borders of patella on right knee    Pain description: Dull   Aggravating factors: Squatting down and standing up from sitting after a long period of time   Relieving factors: Celebrex and voltaren  help    PRECAUTIONS: None  RED FLAGS: None   WEIGHT BEARING RESTRICTIONS: No  FALLS:  Has patient fallen in last 6 months? No  LIVING ENVIRONMENT: Lives with: lives with their family Lives in: House/apartment Stairs: Yes: Internal: 13 steps; on right going up and on left going up and External: 3 steps; on right going up Has following equipment at home: None  OCCUPATION: Works at [employer]     PLOF: Independent  PATIENT GOALS: He wants to avoid surgery and decrease pain.    NEXT MD VISIT: October 30th   OBJECTIVE:  Note: Objective measures were completed at Evaluation unless otherwise noted.  VITALS BP 135/86 HR 70  SpO2 100%   DIAGNOSTIC FINDINGS:   CLINICAL DATA:  855384 Pain 144615   EXAM: PORTABLE RIGHT KNEE - 1-2 VIEW   COMPARISON:  Feb 03, 2022   FINDINGS: No acute fracture or dislocation. Mild tricompartmental degenerative changes. No area of erosion or osseous destruction. No unexpected radiopaque foreign body. Soft tissues are unremarkable.   IMPRESSION: Mild tricompartmental degenerative changes.     Electronically Signed   By: Corean Salter M.D.   On: 11/12/2023 12:29  PATIENT SURVEYS:  KOOS Jr:     COGNITION: Overall cognitive status: Within functional limits for tasks assessed     SENSATION: WFL  EDEMA:  Circumferential: R/L 14.5/14.5  MUSCLE LENGTH: Hamstrings: Not performed   Thomas test: + on RLE - on LLE     POSTURE: rounded shoulders  PALPATION: Medial and lateral surface of right  patella     LOWER EXTREMITY ROM:  Active ROM Right eval Left eval  Hip flexion    Hip extension    Hip abduction    Hip adduction    Hip internal rotation    Hip external rotation    Knee flexion 110/115 140  Knee extension    Ankle dorsiflexion    Ankle plantarflexion    Ankle inversion    Ankle eversion     (Blank rows = not tested)  LOWER EXTREMITY MMT:  MMT Right eval Left eval  Hip flexion NT NT   Hip extension 4 4  Hip abduction 4 4  Hip adduction 4- 4-  Hip internal rotation NT  NT  Hip external rotation NT NT  Knee flexion 4+ 4+  Knee extension 4+ 4+  Ankle dorsiflexion 4 4  Ankle plantarflexion    Ankle inversion    Ankle eversion     (  Blank rows = not tested)  FUNCTIONAL TESTS:  Squat: Single Leg Stance R/L 10 sec/8 sec     GAIT: Not assessed                                                                                                                                  TREATMENT DATE:  07/17/24:   Matrix recumbent bike x 5 minutes level 4-2 to improve cardiorespiratory endurance and BLE strengthening  TRX squats 3 x 10  Sidestepping with BTB around knees 4 x 20' (10' L, 10' R) Lateral step downs 2 x 10 with each LE   Therex Knee extension at OMEGA 3 x 10 - 25# Leg press at OMEGA 3 x 10 - 75# Hip flexion at Matrix 3 x 10 - 70# on L LE, 55# on R LE  Hip abduction at Matrix 3 x 10 - 55# each LE  Hip extension at Matrix 3 x 10 - 70# each LE    07/04/24:  Matrix recumbent bike x 5 minutes level 4-2 to improve cardiorespiratory endurance and BLE strengthening  TRX squats 3 x 10 Fwd step downs 6 step x 10 each LE with single UE support - easy Static lunge with single UE support 3 x 10 each LE   Therex Leg press at OMEGA 3 x 10 - 75# Hip flexion at Matrix 3 x 10 - 70# on L LE, 55# on R LE  Hip abduction at Matrix 3 x 10 - 55# each LE  Hip extension at Matrix 3 x 10 - 70# each LE    07/02/24: THEREX  Matrix  Recumbent bike at 20 5  min Single Leg Stance R/L 10 sec/ 8 sec Standing Hip Abduction with BUE support 2 x 10  Standing Hip Abduction with 1 UE support 2 X 10 Standing Hip Abduction with BUE support with red band 2 x 10  -min VC to stop externally rotating toes   THERAC   Squat with 24 inch height  2 x 10   -min VC to keep knees behind toes Wall Sits to 80 degs 2 x 10   -min VC to keep knees behind toes   MANUAL: on Right knee and towel propped behind right knee.  PFJ Cranial Glide Grade III 2 x 20  PFJ Caudal Glide Grade III 2 x 20   Education on self mobilization  for PFJ caudal glide 2 x 10      06/27/24  THEREX   RLE Knee Flexion AAROM 3 x 10   Squats with fat pad impingement 1 x 10   -Pt reports same amount of knee pain with and without the tape.   PATIENT EDUCATION:  Education details: Form and technique for correct performance of exercise and education on use of icing to decrease swelling and relieve pain.   Person educated: Patient Education method: Explanation, Demonstration, Verbal cues, and Handouts Education comprehension: verbalized understanding, returned  demonstration, and verbal cues required  HOME EXERCISE PROGRAM: Access Code: [email or web address] URL: [email or web address] Date: 07/02/2024 Prepared by: Toribio Servant  Exercises - Seated Knee Flexion AAROM  - 1 x daily - 7 x weekly - 2 sets - 10 reps - 2 sec  hold - Wall Squat  - 3-4 x weekly - 2 sets - 10 reps - 5 sec hold - Standing Hip Abduction With Chair: Alternating With Resistance Loop  - 3-4 x weekly - 3 sets - 10 reps - Long Sitting Inferior Patellar Glide  - 1 x daily - 7 x weekly - 2 sets - 10 reps   ASSESSMENT:  CLINICAL IMPRESSION:   Continued PT POC focused on R knee pain. Session focused on B hip and knee strengthening with increased resistance. Tolerated session well with appropriate fatigue and no increase in pain compared to pain on arrival. Patient appreciative of increase in intensity and feels this is  what he needs as his knee feels better at end of session. 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      OBJECTIVE IMPAIRMENTS: decreased ROM, hypomobility, impaired flexibility, improper body mechanics, and pain.   ACTIVITY LIMITATIONS: carrying, lifting, bending, standing, squatting, and stairs  PARTICIPATION LIMITATIONS: cleaning, laundry, shopping, community activity, occupation, and yard work  PERSONAL FACTORS: Fitness, Time since onset of injury/illness/exacerbation, and 3+ comorbidities: HTN, Depression, and Right knee OA are also affecting patient's functional outcome.   REHAB POTENTIAL: Fair chronicity of issue    CLINICAL DECISION MAKING: Stable/uncomplicated  EVALUATION COMPLEXITY: Low   GOALS: Goals reviewed with patient? No  SHORT TERM GOALS: Target date: 07/11/2024   Patient will demonstrate undestanding of home exercise plan by performing exercises correctly with evidence of good carry over with min to no verbal or tactile cues .   Baseline: nt  07/02/24: Performing independently    Goal status: ACHIEVED   2.  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  Baseline: Knees go forward beyond toes   Goal status: ONGOING     LONG TERM GOALS: Target date: 09/19/2024   Patient will increase KOOS JR score by >=14 patients as evidence of a statistically significant improvement in right knee function that is not due to chance or error Baseline:  15/28 (50%) Goal status: ONGOING     2.  Patient will improve right knee mobility to be within 10% of the AROM of left knee for improved right knee function and to return to bending and  squatting without being limited by pain and discomfort for his job related tasks inventory groceries.  Baseline: Knee Flex R/L 110/140 Goal status: INITIAL  3.  Patient will decrease right knee pain when performing squat to stand to <5/10 NRPS as evidence of improved right knee function and to return to completing squatting and stooping tasks to complete job related tasks without being limited by pain.  Baseline: 7/10 NRPS in anterior surface of right knee Goal status: ONGOING       PLAN:  PT FREQUENCY: 1-2x/week  PT DURATION: 12 weeks  PLANNED INTERVENTIONS: 97164- PT Re-evaluation, 97750- Physical Performance Testing, 97110-Therapeutic exercises, 97530- Therapeutic activity, [id number]- Neuromuscular re-education, 97535- Self Care, 02859- Manual therapy, 02883-  Gait training, 02239- Orthotic Initial, [id number]- Aquatic Therapy, [phone]- Electrical stimulation (unattended), [phone]- Electrical stimulation (manual), [id number]- Traction (mechanical), [phone] (1-2 muscles), 20561 (3+ muscles)- Dry Needling, Patient/Family education, Balance training, Stair training, Taping, Joint mobilization, Joint manipulation, Spinal manipulation, Spinal mobilization, Vestibular training, DME instructions, Cryotherapy, and Moist heat  PLAN FOR NEXT SESSION: Continue with patellofemoral glides and ongoing strengthening of right knee with static lunges.   Maryanne Finder, PT, DPT Physical Therapist - [place]  [facility]

## 2024-07-17 ENCOUNTER — Encounter: Payer: Self-pay | Admitting: Physical Therapy

## 2024-07-17 ENCOUNTER — Ambulatory Visit: Attending: Physician Assistant

## 2024-07-17 DIAGNOSIS — G8929 Other chronic pain: Secondary | ICD-10-CM | POA: Insufficient documentation

## 2024-07-17 DIAGNOSIS — M25561 Pain in right knee: Secondary | ICD-10-CM | POA: Insufficient documentation

## 2024-07-19 ENCOUNTER — Ambulatory Visit: Admitting: Physical Therapy

## 2024-07-24 ENCOUNTER — Ambulatory Visit: Admitting: Physical Therapy

## 2024-07-26 ENCOUNTER — Ambulatory Visit

## 2024-07-26 ENCOUNTER — Encounter: Payer: Self-pay | Admitting: Physical Therapy

## 2024-07-26 DIAGNOSIS — G8929 Other chronic pain: Secondary | ICD-10-CM | POA: Diagnosis not present

## 2024-07-26 DIAGNOSIS — M25561 Pain in right knee: Secondary | ICD-10-CM | POA: Diagnosis not present

## 2024-07-26 NOTE — Therapy (Signed)
 OUTPATIENT PHYSICAL THERAPY LOWER EXTREMITY TREATMENT   Patient Name: Trevor Boyd MRN: [id number] DOB:August 11, 1961, 63 y.o., male Today's Date: 07/26/2024  END OF SESSION:  PT End of Session - 07/26/24 1515     Visit Number 5    Number of Visits 24    Date for Recertification  09/19/24    Authorization Type [id number]    Authorization - Number of Visits 24    Progress Note Due on Visit 10    PT Start Time 1515    PT Stop Time 1600    PT Time Calculation (min) 45 min    Activity Tolerance Patient tolerated treatment well    Behavior During Therapy [facility] for tasks assessed/performed           Past Medical History:  Diagnosis Date   AKI (acute kidney injury) 10/2022   due to taking meloxicam -pcp took pt off med and kidney function went back to normal limits   Depression    DVT (deep venous thrombosis) (HCC) 08/08/2019   Right calf, behind knee, and right thigh   Elevated alkaline phosphatase level    Elevated blood pressure reading without diagnosis of hypertension    Osteoarthritis of right knee    Pre-diabetes    Past Surgical History:  Procedure Laterality Date   COLONOSCOPY  07/20/2013   Dr. Donnice Boyd, [facility]; Impression: one 3mm polyp in the rectum. Lymphoid aggregate. Repeat 07/25/2018 Dr. Manes   COLONOSCOPY WITH PROPOFOL  N/A 04/11/2020   Procedure: COLONOSCOPY WITH PROPOFOL ;  Surgeon: Trevor Keene NOVAK, MD;  Location: [facility] ENDOSCOPY;  Service: Gastroenterology;  Laterality: N/A;   COLONOSCOPY WITH PROPOFOL  N/A 04/02/2022   Procedure: COLONOSCOPY WITH PROPOFOL ;  Surgeon: Unk Corinn Skiff, MD;  Location: [facility] ENDOSCOPY;  Service: Gastroenterology;  Laterality: N/A;   SHOULDER ARTHROSCOPY WITH SUBACROMIAL DECOMPRESSION AND OPEN ROTATOR C Left 04/05/2023   Procedure: Left shoulder arthroscopic rotator cuff repair, biceps tenodesis, distal clavicle excision, subacromial decompression;  Surgeon: Trevor Priest, MD;  Location: [facility] ORS;  Service: Orthopedics;   Laterality: Left;   WRIST SURGERY Left    Patient Active Problem List   Diagnosis Date Noted   Stage 3a chronic kidney disease (HCC) 06/07/2024   Foot pain, right 06/07/2024   Heartburn 06/07/2024   Primary hypertension 07/20/2023   Screening PSA (prostate specific antigen) 07/20/2023   Mixed hyperlipidemia 07/20/2023   Prediabetes 09/08/2022   Colon cancer screening    Knee pain 01/29/2022   Annual physical exam 06/02/2021   Polyp of colon    Arthritis of knee, degenerative 10/08/2015   Situational stress 10/08/2015    PCP: Kelly Cedar FNP    REFERRING PROVIDER: Dr. Arlyss Schneider     REFERRING DIAG: [phone] (ICD-10-CM) - Chronic pain of right knee   THERAPY DIAG:  Chronic pain of right knee  Rationale for Evaluation and Treatment: Rehabilitation  ONSET DATE: >1 year ago    SUBJECTIVE:   SUBJECTIVE STATEMENT:   Patient reports 7/10 pain in his R knee. Gets temporary relief with PT for a couple hours then back to his normal pain level. Per pt ortho appt on December 11th to check in. Reports next step are gel injections.   PERTINENT HISTORY: Per Dr. Iran note on October 10th 2025   The patient is a 63 y.o. male seen in clinic for right knee pain.  Patient states symptoms presented about 5 years ago.  He attributes symptoms to having started while working at [employer].  He states his work requires  a lot of standing and walking on concrete and it has taken a toll on his right knee.  He does recall having twisted his knee around the time symptoms started as well.  He notes that pain is localized around the kneecap.  He recalls an event where he thinks his kneecap may have dislocated as well.  Pain has been getting worse in the last few years.  He has tried numerous treatment options including Celebrex , Voltaren  gel, 4 corticosteroid injections, Tylenol  and a brace.  His most recent corticosteroid injection was administered last month and only provided relief for 1 week.   All of these interventions have stopped working as of late.  Associated symptoms include locking, stiffness and subjective instability.  No other complaints today.   Occupation: Psychologist, sport and exercise at [employer] PAIN:  Are you having pain? Yes: NPRS scale: 1/10 sitting, 7/10 (worst)   Pain location: Medial and Lateral borders of patella on right knee    Pain description: Dull   Aggravating factors: Squatting down and standing up from sitting after a long period of time   Relieving factors: Celebrex  and voltaren  help    PRECAUTIONS: None  RED FLAGS: None   WEIGHT BEARING RESTRICTIONS: No  FALLS:  Has patient fallen in last 6 months? No  LIVING ENVIRONMENT: Lives with: lives with their family Lives in: House/apartment Stairs: Yes: Internal: 13 steps; on right going up and on left going up and External: 3 steps; on right going up Has following equipment at home: None  OCCUPATION: Works at [employer]     PLOF: Independent  PATIENT GOALS: He wants to avoid surgery and decrease pain.    NEXT MD VISIT: October 30th   OBJECTIVE:  Note: Objective measures were completed at Evaluation unless otherwise noted.  VITALS BP 135/86 HR 70  SpO2 100%   DIAGNOSTIC FINDINGS:   CLINICAL DATA:  855384 Pain 144615   EXAM: PORTABLE RIGHT KNEE - 1-2 VIEW   COMPARISON:  Feb 03, 2022   FINDINGS: No acute fracture or dislocation. Mild tricompartmental degenerative changes. No area of erosion or osseous destruction. No unexpected radiopaque foreign body. Soft tissues are unremarkable.   IMPRESSION: Mild tricompartmental degenerative changes.     Electronically Signed   By: Trevor Boyd M.D.   On: 11/12/2023 12:29  PATIENT SURVEYS:  KOOS Jr:     COGNITION: Overall cognitive status: Within functional limits for tasks assessed     SENSATION: WFL  EDEMA:  Circumferential: R/L 14.5/14.5  MUSCLE LENGTH: Hamstrings: Not performed   Thomas test: + on RLE -  on LLE     POSTURE: rounded shoulders  PALPATION: Medial and lateral surface of right patella     LOWER EXTREMITY ROM:  Active ROM Right eval Left eval  Hip flexion    Hip extension    Hip abduction    Hip adduction    Hip internal rotation    Hip external rotation    Knee flexion 110/115 140  Knee extension    Ankle dorsiflexion    Ankle plantarflexion    Ankle inversion    Ankle eversion     (Blank rows = not tested)  LOWER EXTREMITY MMT:  MMT Right eval Left eval  Hip flexion NT NT   Hip extension 4 4  Hip abduction 4 4  Hip adduction 4- 4-  Hip internal rotation NT  NT  Hip external rotation NT NT  Knee flexion 4+ 4+  Knee extension 4+ 4+  Ankle dorsiflexion 4 4  Ankle plantarflexion    Ankle inversion    Ankle eversion     (Blank rows = not tested)  FUNCTIONAL TESTS:  Squat: Single Leg Stance R/L 10 sec/8 sec     GAIT: Not assessed                                                                                                                                  TREATMENT DATE:  07/26/24:    There.Ex:  Matrix recumbent bike x 5 minutes level 4 to improve cardiorespiratory endurance and BLE strengthening    Hip flexion at Matrix 3 x 10 - 70# on L LE, 55# on R LE   Hip abduction at Matrix 3 x 10 - 55# each LE   Hip extension at Matrix 3 x 10 - 70# each LE   Supine SLR with 10# AW at ankle on RLE: 3x10. 2x10 on LLE with reports of easier completion due to more muscle strength.   There.Act: Sidestepping with Blue TB around knees 4 x 20' down and back  Heel raises on 6 step with BUE support: 3x15  Sled pushes 90#: 6x10 meters      PATIENT EDUCATION:  Education details: Form and technique for correct performance of exercise and education on use of icing to decrease swelling and relieve pain.   Person educated: Patient Education method: Explanation, Demonstration, Verbal cues, and Handouts Education comprehension: verbalized understanding,  returned demonstration, and verbal cues required  HOME EXERCISE PROGRAM: Access Code: [email or web address] URL: [email or web address] Date: 07/26/2024 Prepared by: Dorina Kingfisher  Exercises - Seated Knee Flexion AAROM  - 1 x daily - 7 x weekly - 2 sets - 10 reps - 2 sec  hold - Standing Hip Abduction With Chair: Alternating With Resistance Loop  - 3-4 x weekly - 3 sets - 10 reps - Long Sitting Inferior Patellar Glide  - 1 x daily - 7 x weekly - 2 sets - 10 reps - Squat with Chair Touch  - 1 x daily - 3-4 x weekly - 3 sets - 6-8 reps  Access Code: [email or web address] URL: [email or web address] Date: 07/02/2024 Prepared by: Toribio Servant  Exercises - Seated Knee Flexion AAROM  - 1 x daily - 7 x weekly - 2 sets - 10 reps - 2 sec  hold - Wall Squat  - 3-4 x weekly - 2 sets - 10 reps - 5 sec hold - Standing Hip Abduction With Chair: Alternating With Resistance Loop  - 3-4 x weekly - 3 sets - 10 reps - Long Sitting Inferior Patellar Glide  - 1 x daily - 7 x weekly - 2 sets - 10 reps   ASSESSMENT:  CLINICAL IMPRESSION:   Continued PT POC focused on R knee pain. Trialed different OKC exercises and sled pushes today without exacerbation. Updated HEP to reflect further LE strength training with good tolerance. Provided updated TB and discussed band positioning  for hip strength progression. Pt verbalized understanding. 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     OBJECTIVE IMPAIRMENTS: decreased ROM, hypomobility, impaired flexibility, improper body mechanics, and pain.   ACTIVITY LIMITATIONS: carrying, lifting, bending, standing, squatting, and stairs  PARTICIPATION LIMITATIONS: cleaning, laundry, shopping, community activity, occupation, and yard work  PERSONAL FACTORS: Fitness, Time since onset of injury/illness/exacerbation, and 3+  comorbidities: HTN, Depression, and Right knee OA are also affecting patient's functional outcome.   REHAB POTENTIAL: Fair chronicity of issue    CLINICAL DECISION MAKING: Stable/uncomplicated  EVALUATION COMPLEXITY: Low   GOALS: Goals reviewed with patient? No  SHORT TERM GOALS: Target date: 07/11/2024   Patient will demonstrate undestanding of home exercise plan by performing exercises correctly with evidence of good carry over with min to no verbal or tactile cues .   Baseline: nt  07/02/24: Performing independently    Goal status: ACHIEVED   2.  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  Baseline: Knees go forward beyond toes   Goal status: ONGOING     LONG TERM GOALS: Target date: 09/19/2024   Patient will increase KOOS JR score by >=14 patients as evidence of a statistically significant improvement in right knee function that is not due to chance or error Baseline:  15/28 (50%) Goal status: ONGOING     2.  Patient will improve right knee mobility to be within 10% of the AROM of left knee for improved right knee function and to return to bending and squatting without being limited by pain and discomfort for his job related tasks inventory groceries.  Baseline: Knee Flex R/L 110/140 Goal status: INITIAL  3.  Patient will decrease right knee pain when performing squat to stand to <5/10 NRPS as evidence of improved right knee function and to return to completing squatting and stooping tasks to complete job related tasks without being limited by pain.  Baseline: 7/10 NRPS in anterior surface of right knee Goal status: ONGOING       PLAN:  PT FREQUENCY: 1-2x/week  PT DURATION: 12 weeks  PLANNED INTERVENTIONS: 97164- PT Re-evaluation, 97750- Physical Performance Testing,  97110-Therapeutic exercises, 97530- Therapeutic activity, [id number]- Neuromuscular re-education, 97535- Self Care, 02859- Manual therapy, [id number]- Gait training, [phone]- Orthotic Initial, [phone]- Aquatic Therapy, [phone]- Electrical stimulation (unattended), [phone]- Electrical stimulation (manual), [id number]- Traction (mechanical), 20560 (1-2 muscles), 20561 (3+ muscles)- Dry Needling, Patient/Family education, Balance training, Stair training, Taping, Joint mobilization, Joint manipulation, Spinal manipulation, Spinal mobilization, Vestibular training, DME instructions, Cryotherapy, and Moist heat  PLAN FOR NEXT SESSION: Limit CKC deep knee flexion. Continue with patellofemoral glides and ongoing strengthening of right knee with static lunges.  Dorina HERO. Fairly IV, PT, DPT Physical Therapist- [place]  [facility]

## 2024-07-27 NOTE — Therapy (Signed)
 OUTPATIENT PHYSICAL THERAPY LOWER EXTREMITY TREATMENT   Patient Name: Trevor Boyd MRN: [id number] DOB:August 25, 1961, 63 y.o., male Today's Date: 07/27/2024  END OF SESSION:     Past Medical History:  Diagnosis Date   AKI (acute kidney injury) 10/2022   due to taking meloxicam -pcp took pt off med and kidney function went back to normal limits   Depression    DVT (deep venous thrombosis) (HCC) 08/08/2019   Right calf, behind knee, and right thigh   Elevated alkaline phosphatase level    Elevated blood pressure reading without diagnosis of hypertension    Osteoarthritis of right knee    Pre-diabetes    Past Surgical History:  Procedure Laterality Date   COLONOSCOPY  07/20/2013   Dr. Donnice Manes, [facility]; Impression: one 3mm polyp in the rectum. Lymphoid aggregate. Repeat 07/25/2018 Dr. Manes   COLONOSCOPY WITH PROPOFOL  N/A 04/11/2020   Procedure: COLONOSCOPY WITH PROPOFOL ;  Surgeon: Janalyn Keene NOVAK, MD;  Location: [facility] ENDOSCOPY;  Service: Gastroenterology;  Laterality: N/A;   COLONOSCOPY WITH PROPOFOL  N/A 04/02/2022   Procedure: COLONOSCOPY WITH PROPOFOL ;  Surgeon: Unk Corinn Skiff, MD;  Location: [facility] ENDOSCOPY;  Service: Gastroenterology;  Laterality: N/A;   SHOULDER ARTHROSCOPY WITH SUBACROMIAL DECOMPRESSION AND OPEN ROTATOR C Left 04/05/2023   Procedure: Left shoulder arthroscopic rotator cuff repair, biceps tenodesis, distal clavicle excision, subacromial decompression;  Surgeon: Tobie Priest, MD;  Location: [facility] ORS;  Service: Orthopedics;  Laterality: Left;   WRIST SURGERY Left    Patient Active Problem List   Diagnosis Date Noted   Stage 3a chronic kidney disease (HCC) 06/07/2024   Foot pain, right 06/07/2024   Heartburn 06/07/2024   Primary hypertension 07/20/2023   Screening PSA (prostate specific antigen) 07/20/2023   Mixed hyperlipidemia 07/20/2023   Prediabetes 09/08/2022   Colon cancer screening    Knee pain 01/29/2022   Annual physical exam 06/02/2021    Polyp of colon    Arthritis of knee, degenerative 10/08/2015   Situational stress 10/08/2015    PCP: Kelly Cedar FNP    REFERRING PROVIDER: Dr. Arlyss Schneider     REFERRING DIAG: [phone] (ICD-10-CM) - Chronic pain of right knee   THERAPY DIAG:  Chronic pain of right knee  Rationale for Evaluation and Treatment: Rehabilitation  ONSET DATE: >1 year ago    SUBJECTIVE:   SUBJECTIVE STATEMENT:  *** Patient reports 7/10 pain in his R knee. Gets temporary relief with PT for a couple hours then back to his normal pain level. Per pt ortho appt on December 11th to check in. Reports next step are gel injections.   PERTINENT HISTORY: Per Dr. Iran note on October 10th 2025   The patient is a 63 y.o. male seen in clinic for right knee pain.  Patient states symptoms presented about 5 years ago.  He attributes symptoms to having started while working at [employer].  He states his work requires a lot of standing and walking on concrete and it has taken a toll on his right knee.  He does recall having twisted his knee around the time symptoms started as well.  He notes that pain is localized around the kneecap.  He recalls an event where he thinks his kneecap may have dislocated as well.  Pain has been getting worse in the last few years.  He has tried numerous treatment options including Celebrex , Voltaren  gel, 4 corticosteroid injections, Tylenol  and a brace.  His most recent corticosteroid injection was administered last month and only provided relief for 1  week.  All of these interventions have stopped working as of late.  Associated symptoms include locking, stiffness and subjective instability.  No other complaints today.   Occupation: Psychologist, sport and exercise at [employer] PAIN:  Are you having pain? Yes: NPRS scale: 1/10 sitting, 7/10 (worst)   Pain location: Medial and Lateral borders of patella on right knee    Pain description: Dull   Aggravating factors: Squatting down and standing up from  sitting after a long period of time   Relieving factors: Celebrex  and voltaren  help    PRECAUTIONS: None  RED FLAGS: None   WEIGHT BEARING RESTRICTIONS: No  FALLS:  Has patient fallen in last 6 months? No  LIVING ENVIRONMENT: Lives with: lives with their family Lives in: House/apartment Stairs: Yes: Internal: 13 steps; on right going up and on left going up and External: 3 steps; on right going up Has following equipment at home: None  OCCUPATION: Works at [employer]     PLOF: Independent  PATIENT GOALS: He wants to avoid surgery and decrease pain.    NEXT MD VISIT: October 30th   OBJECTIVE:  Note: Objective measures were completed at Evaluation unless otherwise noted.  VITALS BP 135/86 HR 70  SpO2 100%   DIAGNOSTIC FINDINGS:   CLINICAL DATA:  855384 Pain 144615   EXAM: PORTABLE RIGHT KNEE - 1-2 VIEW   COMPARISON:  Feb 03, 2022   FINDINGS: No acute fracture or dislocation. Mild tricompartmental degenerative changes. No area of erosion or osseous destruction. No unexpected radiopaque foreign body. Soft tissues are unremarkable.   IMPRESSION: Mild tricompartmental degenerative changes.     Electronically Signed   By: Corean Salter M.D.   On: 11/12/2023 12:29  PATIENT SURVEYS:  KOOS Jr:     COGNITION: Overall cognitive status: Within functional limits for tasks assessed     SENSATION: WFL  EDEMA:  Circumferential: R/L 14.5/14.5  MUSCLE LENGTH: Hamstrings: Not performed   Thomas test: + on RLE - on LLE     POSTURE: rounded shoulders  PALPATION: Medial and lateral surface of right patella     LOWER EXTREMITY ROM:  Active ROM Right eval Left eval  Hip flexion    Hip extension    Hip abduction    Hip adduction    Hip internal rotation    Hip external rotation    Knee flexion 110/115 140  Knee extension    Ankle dorsiflexion    Ankle plantarflexion    Ankle inversion    Ankle eversion     (Blank rows = not  tested)  LOWER EXTREMITY MMT:  MMT Right eval Left eval  Hip flexion NT NT   Hip extension 4 4  Hip abduction 4 4  Hip adduction 4- 4-  Hip internal rotation NT  NT  Hip external rotation NT NT  Knee flexion 4+ 4+  Knee extension 4+ 4+  Ankle dorsiflexion 4 4  Ankle plantarflexion    Ankle inversion    Ankle eversion     (Blank rows = not tested)  FUNCTIONAL TESTS:  Squat: Single Leg Stance R/L 10 sec/8 sec     GAIT: Not assessed  TREATMENT DATE:  07/27/24 ***  There.Ex:  Matrix recumbent bike x 5 minutes level 4 to improve cardiorespiratory endurance and BLE strengthening   Hip flexion at Matrix 3 x 10 - 70# on L LE, 55# on R LE   Hip abduction at Matrix 3 x 10 - 55# each LE   Hip extension at Matrix 3 x 10 - 70# each LE   Supine SLR with 10# AW at ankle on RLE: 3x10. 2x10 on LLE with reports of easier completion due to more muscle strength.    There.Act: Sidestepping with Blue TB around knees 4 x 20' down and back  Heel raises on 6 step with BUE support: 3x15  Sled pushes 90#: 6x10 meters      PATIENT EDUCATION:  Education details: Form and technique for correct performance of exercise and education on use of icing to decrease swelling and relieve pain.   Person educated: Patient Education method: Explanation, Demonstration, Verbal cues, and Handouts Education comprehension: verbalized understanding, returned demonstration, and verbal cues required  HOME EXERCISE PROGRAM: Access Code: [email or web address] URL: [email or web address] Date: 07/26/2024 Prepared by: Dorina Kingfisher  Exercises - Seated Knee Flexion AAROM  - 1 x daily - 7 x weekly - 2 sets - 10 reps - 2 sec  hold - Standing Hip Abduction With Chair: Alternating With Resistance Loop  - 3-4 x weekly - 3 sets - 10 reps - Long Sitting Inferior Patellar Glide  - 1  x daily - 7 x weekly - 2 sets - 10 reps - Squat with Chair Touch  - 1 x daily - 3-4 x weekly - 3 sets - 6-8 reps  Access Code: [email or web address] URL: [email or web address] Date: 07/02/2024 Prepared by: Toribio Servant  Exercises - Seated Knee Flexion AAROM  - 1 x daily - 7 x weekly - 2 sets - 10 reps - 2 sec  hold - Wall Squat  - 3-4 x weekly - 2 sets - 10 reps - 5 sec hold - Standing Hip Abduction With Chair: Alternating With Resistance Loop  - 3-4 x weekly - 3 sets - 10 reps - Long Sitting Inferior Patellar Glide  - 1 x daily - 7 x weekly - 2 sets - 10 reps   ASSESSMENT:  CLINICAL IMPRESSION:  *** Continued PT POC focused on R knee pain. Trialed different OKC exercises and sled pushes today without exacerbation. Updated HEP to reflect further LE strength training with good tolerance. Provided updated TB and discussed band positioning for hip strength progression. Pt verbalized understanding. 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     OBJECTIVE IMPAIRMENTS: decreased ROM, hypomobility, impaired flexibility, improper body mechanics, and pain.   ACTIVITY LIMITATIONS: carrying, lifting, bending, standing, squatting, and stairs  PARTICIPATION LIMITATIONS: cleaning, laundry, shopping, community activity, occupation, and yard work  PERSONAL FACTORS: Fitness, Time since onset of injury/illness/exacerbation, and 3+ comorbidities: HTN, Depression, and Right knee OA are also affecting patient's functional outcome.   REHAB POTENTIAL: Fair chronicity of issue    CLINICAL DECISION MAKING: Stable/uncomplicated  EVALUATION COMPLEXITY: Low   GOALS: Goals reviewed with patient? No  SHORT TERM GOALS: Target date: 07/11/2024   Patient will demonstrate undestanding of home exercise plan by performing exercises correctly with evidence of good  carry over with min to no verbal or tactile cues .   Baseline: nt  07/02/24: Performing independently  Goal status: ACHIEVED   2.  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  Baseline: Knees go forward beyond toes   Goal status: ONGOING     LONG TERM GOALS: Target date: 09/19/2024   Patient will increase KOOS JR score by >=14 patients as evidence of a statistically significant improvement in right knee function that is not due to chance or error Baseline:  15/28 (50%) Goal status: ONGOING     2.  Patient will improve right knee mobility to be within 10% of the AROM of left knee for improved right knee function and to return to bending and squatting without being limited by pain and discomfort for his job related tasks inventory groceries.  Baseline: Knee Flex R/L 110/140 Goal status: INITIAL  3.  Patient will decrease right knee pain when performing squat to stand to <5/10 NRPS as evidence of improved right knee function and to return to completing squatting and stooping tasks to complete job related tasks without being limited by pain.  Baseline: 7/10 NRPS in anterior surface of right knee Goal status: ONGOING       PLAN:  PT FREQUENCY: 1-2x/week  PT DURATION: 12 weeks  PLANNED INTERVENTIONS: 97164- PT Re-evaluation, 97750- Physical Performance Testing, 97110-Therapeutic exercises, 97530- Therapeutic activity, [id number]- Neuromuscular re-education, 97535- Self Care, 02859- Manual therapy, [id number]- Gait training, [phone]- Orthotic Initial, [phone]- Aquatic Therapy, [phone]- Electrical stimulation (unattended), [phone]- Electrical stimulation (manual), [id number]- Traction (mechanical), 20560 (1-2 muscles), 20561 (3+ muscles)- Dry Needling, Patient/Family education, Balance training, Stair training, Taping,  Joint mobilization, Joint manipulation, Spinal manipulation, Spinal mobilization, Vestibular training, DME instructions, Cryotherapy, and Moist heat  PLAN FOR NEXT SESSION: Limit CKC deep knee flexion. Continue with patellofemoral glides and ongoing strengthening of right knee with static lunges.  Maryanne Finder, PT, DPT Physical Therapist - [place]  [facility]

## 2024-07-30 ENCOUNTER — Ambulatory Visit

## 2024-07-30 ENCOUNTER — Encounter: Payer: Self-pay | Admitting: Physical Therapy

## 2024-07-30 DIAGNOSIS — M25561 Pain in right knee: Secondary | ICD-10-CM | POA: Diagnosis not present

## 2024-07-30 DIAGNOSIS — G8929 Other chronic pain: Secondary | ICD-10-CM

## 2024-08-06 ENCOUNTER — Ambulatory Visit: Admitting: Physical Therapy

## 2024-08-07 ENCOUNTER — Ambulatory Visit: Admitting: Physical Therapy

## 2024-08-09 ENCOUNTER — Ambulatory Visit: Admitting: Physical Therapy

## 2024-08-13 ENCOUNTER — Ambulatory Visit: Admitting: Physical Therapy

## 2024-08-15 ENCOUNTER — Telehealth: Payer: Self-pay

## 2024-08-15 ENCOUNTER — Ambulatory Visit: Admitting: Physical Therapy

## 2024-08-15 NOTE — Telephone Encounter (Signed)
 Per April gel is not approved yet. I called and advised pt and cancelled his appt for tomorrow and advised pt that April would reach out soon

## 2024-08-15 NOTE — Telephone Encounter (Signed)
Called and left a VM for patient to return my call concerning gel injection.

## 2024-08-16 ENCOUNTER — Ambulatory Visit: Admitting: Physician Assistant

## 2024-08-22 ENCOUNTER — Ambulatory Visit

## 2024-08-22 DIAGNOSIS — M1711 Unilateral primary osteoarthritis, right knee: Secondary | ICD-10-CM

## 2024-08-22 DIAGNOSIS — G8929 Other chronic pain: Secondary | ICD-10-CM

## 2024-08-22 DIAGNOSIS — M25561 Pain in right knee: Secondary | ICD-10-CM

## 2024-08-22 MED ORDER — SODIUM HYALURONATE (VISCOSUP) 20 MG/2ML IX SOSY
20.0000 mg | PREFILLED_SYRINGE | INTRA_ARTICULAR | Status: AC | PRN
  Administered 2024-08-22: 12:00:00 20 mg via INTRA_ARTICULAR

## 2024-08-22 NOTE — Progress Notes (Signed)
 Orthopedic Follow-Up Note  6-week follow-up right knee patellofemoral arthritis    SUBJECTIVE:   Trevor Boyd is a 63 y.o. year old  who presents for follow up of right knee patellofemoral arthritis.  Patient was last seen in the office on 06/15/2024.  At that appointment, we had recommended formal physical therapy as well as consideration of viscosupplementation injection as patient had failed prior corticosteroid injections.  He reports he did undergo weeks of physical therapy with no changes in his symptoms since his last visit.  He presents today for viscosupplementation injection #1 in a series of 3 over the next 3 weeks.  Patient with no changes in symptoms or new complaints today.     Past Medical History:  Diagnosis Date   AKI (acute kidney injury) 10/2022   due to taking meloxicam -pcp took pt off med and kidney function went back to normal limits   Depression    DVT (deep venous thrombosis) (HCC) 08/08/2019   Right calf, behind knee, and right thigh   Elevated alkaline phosphatase level    Elevated blood pressure reading without diagnosis of hypertension    Osteoarthritis of right knee    Pre-diabetes    Past Surgical History:  Procedure Laterality Date   COLONOSCOPY  07/20/2013   Dr. Donnice Manes, [facility] [place]; Impression: one 3mm polyp in the rectum. Lymphoid aggregate. Repeat 07/25/2018 Dr. Manes   COLONOSCOPY WITH PROPOFOL  N/A 04/11/2020   Procedure: COLONOSCOPY WITH PROPOFOL ;  Surgeon: Janalyn Keene NOVAK, MD;  Location: [facility] ENDOSCOPY;  Service: Gastroenterology;  Laterality: N/A;   COLONOSCOPY WITH PROPOFOL  N/A 04/02/2022   Procedure: COLONOSCOPY WITH PROPOFOL ;  Surgeon: Unk Corinn Skiff, MD;  Location: [facility] ENDOSCOPY;  Service: Gastroenterology;  Laterality: N/A;   SHOULDER ARTHROSCOPY WITH SUBACROMIAL DECOMPRESSION AND OPEN ROTATOR C Left 04/05/2023   Procedure: Left shoulder arthroscopic rotator cuff repair, biceps tenodesis, distal clavicle excision, subacromial  decompression;  Surgeon: Tobie Priest, MD;  Location: [facility] ORS;  Service: Orthopedics;  Laterality: Left;   WRIST SURGERY Left    Current Medications[1] Allergies[2] Social History   Socioeconomic History   Marital status: Married    Spouse name: Not on file   Number of children: Not on file   Years of education: Not on file   Highest education level: 12th grade  Occupational History   Not on file  Tobacco Use   Smoking status: Never   Smokeless tobacco: Never  Vaping Use   Vaping status: Never Used  Substance and Sexual Activity   Alcohol use: No    Alcohol/week: 0.0 standard drinks of alcohol   Drug use: No   Sexual activity: Yes    Partners: Female  Other Topics Concern   Not on file  Social History Narrative   Not on file   Social Drivers of Health   Tobacco Use: Low Risk (07/30/2024)   Patient History    Smoking Tobacco Use: Never    Smokeless Tobacco Use: Never    Passive Exposure: Not on file  Financial Resource Strain: Low Risk (07/01/2024)   Overall Financial Resource Strain (CARDIA)    Difficulty of Paying Living Expenses: Not hard at all  Food Insecurity: No Food Insecurity (07/01/2024)   Epic    Worried About Radiation Protection Practitioner of Food in the Last Year: Never true    Ran Out of Food in the Last Year: Never true  Transportation Needs: No Transportation Needs (07/01/2024)   Epic    Lack of Transportation (Medical): No    Lack of  Transportation (Non-Medical): No  Physical Activity: Sufficiently Active (07/01/2024)   Exercise Vital Sign    Days of Exercise per Week: 7 days    Minutes of Exercise per Session: 150+ min  Stress: No Stress Concern Present (07/01/2024)   [employer] of Occupational Health - Occupational Stress Questionnaire    Feeling of Stress: Not at all  Social Connections: Moderately Isolated (07/01/2024)   Social Connection and Isolation Panel    Frequency of Communication with Friends and Family: More than three times a week     Frequency of Social Gatherings with Friends and Family: More than three times a week    Attends Religious Services: Never    Database Administrator or Organizations: No    Attends Engineer, Structural: Not on file    Marital Status: Married  Catering Manager Violence: Not on file  Depression (PHQ2-9): Low Risk (07/05/2024)   Depression (PHQ2-9)    PHQ-2 Score: 0  Alcohol Screen: Not on file  Housing: Low Risk (07/01/2024)   Epic    Unable to Pay for Housing in the Last Year: No    Number of Times Moved in the Last Year: 0    Homeless in the Last Year: No  Utilities: Not on file  Health Literacy: Not on file   Family History  Problem Relation Age of Onset   Hypertension Mother    Hypertension Sister    Heart attack Maternal Grandmother    Heart attack Maternal Grandfather      ROS: A review of systems was performed and is negative unless stated above in HPI    OBJECTIVE:    Constitutional:   The patient is alert and oriented x 3, appears to be stated age and in no distress.   Orthopaedic Examination:   Knee Examination (focused):   Skin is intact about the right knee without obvious deformity, erythema or ecchymosis.      RIGHT  AROM (degrees) PROM (degrees)   0-130 0-130  Palpation (pain): Effusion none    Medial joint line tenderness negative    Lateral joint line tenderness negative  Instability: Varus @ 0 degrees            @ 30 degrees none none    Valgus @ 0 degrees             @ 30 degrees none none  Special Tests: Lachman's Not tested    Posterior drawer none    Anterior drawer none    McMurray negative  Patella: Palpation (pain) Positive-medial facet    Mobility < 2 quadrants    Apprehension negative  Other: Knee flexion strength   5/5    Knee extension strength  5/5    Focal areas of tenderness to palpation: Medial facet of patella with palpable crepitance when ranging from flexion to extension. Positive patella grind.    Vascular/Lymphatic: foot warm and well perfused Neurologic: Sensation intact to light touch to Superficial peroneal/Deep peroneal/Tibial/Sural/Saphenous nerves    IMAGING:   Xrays: Right knee sunrise view obtained in office today and reviewed with patient.  Per my interpretation, mild to moderate degenerative changes noted about the patellofemoral joint with osteophyte formation.  Radiographic findings reviewed with patient     ASSESSMENT:  Right knee patellofemoral arthritis     PLAN:   Patient was seen in office today for follow up of right knee patellofemoral arthritis. Since last encounter, patient reports no changes in symptoms.  Since last encounter, patient had  undergone formal physical therapy without relief.  He presents to clinic today for first right knee viscosupplementation injection in a series of 3 over the next 3 weeks.  Risks and benefits of injection were discussed with patient and he would like to proceed.  Injection #1 provided in office today and patient tolerated procedure well. We will plan on seeing patient back in office next week for injection #2.  All questions and concerns were answered to the best of my ability.  Patient can call any time with further concerns.  Right knee viscosupplementation injection procedure: The risks and benefits of a viscosupplementation injection were discussed and the patient wishes to proceed.  The lateral compartment of the right knee was cleansed with alcohol swabs and ChloraPrep.  The skin was flash cooled with Ethyl Chloride, and using sterile technique, a 21 gauge needle was immediately introduced into the joint.  After aspiration revealed a flash of clear yellow tinged joint fluid the injectate which consisted of 2 cc of Euflexxa easily flowed into the joint.  The needle was withdrawn and pressure was applied for hemostasis.  A bandage was applied.  The patient tolerated the procedure well. Patient instructed to ice the area  tonight if sore.  General reactions: A flare reaction. This generally occurs 6-8 hours after receiving the injection.  Ice the area and take Tylenol  for pain.  If the pain lasts longer than 48 hours call the office.   Some patients may experience flushing, increased heart rate, red face or increase in body temperature.  This is rare but can happen.   Over the counter Benadryl, if appropriate, often reduces these symptoms.  For a severe reaction contact your family doctor or go to the emergency room.  If you have any other questions, feel free to ask.   Large Joint Inj: R knee on 08/22/2024 11:46 AM Indications: pain Details: (21 G) 1.5 in needle, lateral approach Medications: 20 mg Sodium Hyaluronate (Viscosup) 20 MG/2ML Outcome: tolerated well, no immediate complications Procedure, treatment alternatives, risks and benefits explained, specific risks discussed. Patient was prepped and draped in the usual sterile fashion.       Arlyss Schneider, DO Orthopedic Surgery & Sports Medicine [facility]     [1]  Current Outpatient Medications:    celecoxib  (CELEBREX ) 200 MG capsule, Take 1 capsule (200 mg total) by mouth 2 (two) times daily., Disp: 60 capsule, Rfl: 1   sertraline  (ZOLOFT ) 100 MG tablet, Take 1 tablet (100 mg total) by mouth daily., Disp: 90 tablet, Rfl: 3 [2] No Known Allergies

## 2024-08-25 ENCOUNTER — Other Ambulatory Visit: Payer: Self-pay | Admitting: Physician Assistant

## 2024-08-25 DIAGNOSIS — M25569 Pain in unspecified knee: Secondary | ICD-10-CM

## 2024-08-28 ENCOUNTER — Other Ambulatory Visit: Payer: Self-pay | Admitting: Physician Assistant

## 2024-08-28 DIAGNOSIS — M25569 Pain in unspecified knee: Secondary | ICD-10-CM

## 2024-08-29 ENCOUNTER — Ambulatory Visit

## 2024-08-29 DIAGNOSIS — M1711 Unilateral primary osteoarthritis, right knee: Secondary | ICD-10-CM

## 2024-08-29 MED ORDER — SODIUM HYALURONATE (VISCOSUP) 20 MG/2ML IX SOSY
20.0000 mg | PREFILLED_SYRINGE | INTRA_ARTICULAR | Status: AC | PRN
  Administered 2024-08-29: 20 mg via INTRA_ARTICULAR

## 2024-08-29 NOTE — Progress Notes (Signed)
 Orthopedic Follow-Up Note  Right knee Euflexxa injection #2    SUBJECTIVE:   Trevor Boyd is a 63 y.o. year old  who presents for right knee Euflexxa injection #2.  Patient was seen last week for Euflexxa injection #1.  He reports he tolerated the procedure well.  Denies inflammation, increasing pain, fever or chills following that injection.  He does report slight improvement in his right knee symptoms following the first injection.  He reports today for Euflexxa injection #2.  No new concerns today.     Past Medical History:  Diagnosis Date   AKI (acute kidney injury) 10/2022   due to taking meloxicam -pcp took pt off med and kidney function went back to normal limits   Depression    DVT (deep venous thrombosis) (HCC) 08/08/2019   Right calf, behind knee, and right thigh   Elevated alkaline phosphatase level    Elevated blood pressure reading without diagnosis of hypertension    Osteoarthritis of right knee    Pre-diabetes    Past Surgical History:  Procedure Laterality Date   COLONOSCOPY  07/20/2013   Dr. Donnice Manes, [facility]; Impression: one 3mm polyp in the rectum. Lymphoid aggregate. Repeat 07/25/2018 Dr. Manes   COLONOSCOPY WITH PROPOFOL  N/A 04/11/2020   Procedure: COLONOSCOPY WITH PROPOFOL ;  Surgeon: Janalyn Keene NOVAK, MD;  Location: [facility] ENDOSCOPY;  Service: Gastroenterology;  Laterality: N/A;   COLONOSCOPY WITH PROPOFOL  N/A 04/02/2022   Procedure: COLONOSCOPY WITH PROPOFOL ;  Surgeon: Unk Corinn Skiff, MD;  Location: [facility] ENDOSCOPY;  Service: Gastroenterology;  Laterality: N/A;   SHOULDER ARTHROSCOPY WITH SUBACROMIAL DECOMPRESSION AND OPEN ROTATOR C Left 04/05/2023   Procedure: Left shoulder arthroscopic rotator cuff repair, biceps tenodesis, distal clavicle excision, subacromial decompression;  Surgeon: Tobie Priest, MD;  Location: [facility] ORS;  Service: Orthopedics;  Laterality: Left;   WRIST SURGERY Left    Current Medications[1] Allergies[2] Social History    Socioeconomic History   Marital status: Married    Spouse name: Not on file   Number of children: Not on file   Years of education: Not on file   Highest education level: 12th grade  Occupational History   Not on file  Tobacco Use   Smoking status: Never   Smokeless tobacco: Never  Vaping Use   Vaping status: Never Used  Substance and Sexual Activity   Alcohol use: No    Alcohol/week: 0.0 standard drinks of alcohol   Drug use: No   Sexual activity: Yes    Partners: Female  Other Topics Concern   Not on file  Social History Narrative   Not on file   Social Drivers of Health   Tobacco Use: Low Risk (08/22/2024)   Patient History    Smoking Tobacco Use: Never    Smokeless Tobacco Use: Never    Passive Exposure: Not on file  Financial Resource Strain: Low Risk (07/01/2024)   Overall Financial Resource Strain (CARDIA)    Difficulty of Paying Living Expenses: Not hard at all  Food Insecurity: No Food Insecurity (07/01/2024)   Epic    Worried About Radiation Protection Practitioner of Food in the Last Year: Never true    Ran Out of Food in the Last Year: Never true  Transportation Needs: No Transportation Needs (07/01/2024)   Epic    Lack of Transportation (Medical): No    Lack of Transportation (Non-Medical): No  Physical Activity: Sufficiently Active (07/01/2024)   Exercise Vital Sign    Days of Exercise per Week: 7 days    Minutes of  Exercise per Session: 150+ min  Stress: No Stress Concern Present (07/01/2024)   [employer] of Occupational Health - Occupational Stress Questionnaire    Feeling of Stress: Not at all  Social Connections: Moderately Isolated (07/01/2024)   Social Connection and Isolation Panel    Frequency of Communication with Friends and Family: More than three times a week    Frequency of Social Gatherings with Friends and Family: More than three times a week    Attends Religious Services: Never    Database Administrator or Organizations: No    Attends Museum/gallery Exhibitions Officer: Not on file    Marital Status: Married  Catering Manager Violence: Not on file  Depression (PHQ2-9): Low Risk (07/05/2024)   Depression (PHQ2-9)    PHQ-2 Score: 0  Alcohol Screen: Not on file  Housing: Low Risk (07/01/2024)   Epic    Unable to Pay for Housing in the Last Year: No    Number of Times Moved in the Last Year: 0    Homeless in the Last Year: No  Utilities: Not on file  Health Literacy: Not on file   Family History  Problem Relation Age of Onset   Hypertension Mother    Hypertension Sister    Heart attack Maternal Grandmother    Heart attack Maternal Grandfather      ROS: A review of systems was performed and is negative unless stated above in HPI    OBJECTIVE:    Constitutional:   The patient is alert and oriented x 3, appears to be stated age and in no distress.   Orthopaedic Examination:   Right knee:  Skin is intact about the right knee.  No erythema, ecchymosis, effusion noted.  Nontender about the medial lateral joint lines.  Active and passive knee range of motion 0-125 degrees.       ASSESSMENT:  Right knee osteoarthritis - Euflexxa injection #2 of 3 in office today     PLAN:   Patient was seen in office today for Euflexxa injection #2 of 3.  Patient tolerated injection #1 last week very well without concerns.  He does admit to slight improvement in his symptoms following injection #1.  Risks and benefits of injection were reviewed with patient again today.  He acknowledges and would like to proceed with Euflexxa injection #2.  Injection was provided in office.  Patient tolerated procedure well.  He will follow-up next week for Euflexxa injection #3 of 3.  Caution patient if he were develop significant increase in pain, swelling, redness and associated fever or chills to notify the office right away or go to the emergency department for evaluation.   Follow-up in clinic next week as scheduled  All questions and concerns  were answered to the best of my ability.    Right knee Euflexxa injection procedure: The risks and benefits of a viscosupplementation injection were discussed and the patient wishes to proceed.  The anterolateral compartment of the right knee was cleansed with alcohol swabs and ChloraPrep.  The skin was flash cooled with Ethyl Chloride, and using sterile technique, a 21 gauge needle was immediately introduced into the joint.  After aspiration revealed a flash of clear yellow tinged joint fluid the injectate which consisted of 2cc of Euflexxa easily flowed into the joint.  The needle was withdrawn and pressure was applied for hemostasis.  A bandage was applied.  The patient tolerated the procedure well. Patient instructed to ice the area tonight if sore.  General reactions: A flare reaction. This generally occurs 6-8 hours after receiving the injection.  Ice the area and take Tylenol  for pain.  If the pain lasts longer than 48 hours call the office.   Some patients may experience flushing, increased heart rate, red face or increase in body temperature.  This is rare but can happen.   Over the counter Benadryl, if appropriate, often reduces these symptoms.  For a severe reaction contact your family doctor or go to the emergency room.  If you have any other questions, feel free to ask.   Large Joint Inj: R knee on 08/29/2024 11:04 AM Indications: pain Details: (21 G) 1.5 in needle, anterolateral approach Medications: 20 mg Sodium Hyaluronate (Viscosup) 20 MG/2ML Outcome: tolerated well, no immediate complications Procedure, treatment alternatives, risks and benefits explained, specific risks discussed. Patient was prepped and draped in the usual sterile fashion.       Arlyss Schneider, DO Orthopedic Surgery & Sports Medicine [facility]     [1]  Current Outpatient Medications:    celecoxib  (CELEBREX ) 200 MG capsule, TAKE 1 CAPSULE(200 MG) BY MOUTH TWICE DAILY, Disp: 60 capsule, Rfl: 0    sertraline  (ZOLOFT ) 100 MG tablet, Take 1 tablet (100 mg total) by mouth daily., Disp: 90 tablet, Rfl: 3 [2] No Known Allergies

## 2024-09-03 ENCOUNTER — Ambulatory Visit (INDEPENDENT_AMBULATORY_CARE_PROVIDER_SITE_OTHER)

## 2024-09-03 DIAGNOSIS — M1711 Unilateral primary osteoarthritis, right knee: Secondary | ICD-10-CM

## 2024-09-03 MED ORDER — SODIUM HYALURONATE (VISCOSUP) 20 MG/2ML IX SOSY
20.0000 mg | PREFILLED_SYRINGE | INTRA_ARTICULAR | Status: AC | PRN
  Administered 2024-09-03: 20 mg via INTRA_ARTICULAR

## 2024-09-03 NOTE — Progress Notes (Signed)
 Orthopedic Follow-Up Note  Right knee Euflexxa injection #3    SUBJECTIVE:   Trevor Boyd is a 63 y.o. year old who presents for right knee Euflexxa injection # 3.  Patient was seen last week for Euflexxa injection # 2.  He reports he tolerated the procedure well.  Denies inflammation, increasing pain, fever or chills following that injection.  He does report 50% improvement in his right knee symptoms following the 1st and 2nd injections.  He reports today for Euflexxa injection #3.  No new concerns today.  Patient continues to wear brace during his work shifts.       Past Medical History:  Diagnosis Date   AKI (acute kidney injury) 10/2022   due to taking meloxicam -pcp took pt off med and kidney function went back to normal limits   Depression    DVT (deep venous thrombosis) (HCC) 08/08/2019   Right calf, behind knee, and right thigh   Elevated alkaline phosphatase level    Elevated blood pressure reading without diagnosis of hypertension    Osteoarthritis of right knee    Pre-diabetes    Past Surgical History:  Procedure Laterality Date   COLONOSCOPY  07/20/2013   Dr. Donnice Manes, [facility]; Impression: one 3mm polyp in the rectum. Lymphoid aggregate. Repeat 07/25/2018 Dr. Manes   COLONOSCOPY WITH PROPOFOL  N/A 04/11/2020   Procedure: COLONOSCOPY WITH PROPOFOL ;  Surgeon: Janalyn Keene NOVAK, MD;  Location: [facility] ENDOSCOPY;  Service: Gastroenterology;  Laterality: N/A;   COLONOSCOPY WITH PROPOFOL  N/A 04/02/2022   Procedure: COLONOSCOPY WITH PROPOFOL ;  Surgeon: Unk Corinn Skiff, MD;  Location: [facility] ENDOSCOPY;  Service: Gastroenterology;  Laterality: N/A;   SHOULDER ARTHROSCOPY WITH SUBACROMIAL DECOMPRESSION AND OPEN ROTATOR C Left 04/05/2023   Procedure: Left shoulder arthroscopic rotator cuff repair, biceps tenodesis, distal clavicle excision, subacromial decompression;  Surgeon: Tobie Priest, MD;  Location: [facility] ORS;  Service: Orthopedics;  Laterality: Left;   WRIST SURGERY Left     Current Medications[1] Allergies[2] Social History   Socioeconomic History   Marital status: Married    Spouse name: Not on file   Number of children: Not on file   Years of education: Not on file   Highest education level: 12th grade  Occupational History   Not on file  Tobacco Use   Smoking status: Never   Smokeless tobacco: Never  Vaping Use   Vaping status: Never Used  Substance and Sexual Activity   Alcohol use: No    Alcohol/week: 0.0 standard drinks of alcohol   Drug use: No   Sexual activity: Yes    Partners: Female  Other Topics Concern   Not on file  Social History Narrative   Not on file   Social Drivers of Health   Tobacco Use: Low Risk (08/29/2024)   Patient History    Smoking Tobacco Use: Never    Smokeless Tobacco Use: Never    Passive Exposure: Not on file  Financial Resource Strain: Low Risk (07/01/2024)   Overall Financial Resource Strain (CARDIA)    Difficulty of Paying Living Expenses: Not hard at all  Food Insecurity: No Food Insecurity (07/01/2024)   Epic    Worried About Radiation Protection Practitioner of Food in the Last Year: Never true    Ran Out of Food in the Last Year: Never true  Transportation Needs: No Transportation Needs (07/01/2024)   Epic    Lack of Transportation (Medical): No    Lack of Transportation (Non-Medical): No  Physical Activity: Sufficiently Active (07/01/2024)   Exercise Vital Sign  Days of Exercise per Week: 7 days    Minutes of Exercise per Session: 150+ min  Stress: No Stress Concern Present (07/01/2024)   [employer] of Occupational Health - Occupational Stress Questionnaire    Feeling of Stress: Not at all  Social Connections: Moderately Isolated (07/01/2024)   Social Connection and Isolation Panel    Frequency of Communication with Friends and Family: More than three times a week    Frequency of Social Gatherings with Friends and Family: More than three times a week    Attends Religious Services: Never    Automotive Engineer or Organizations: No    Attends Engineer, Structural: Not on file    Marital Status: Married  Catering Manager Violence: Not on file  Depression (PHQ2-9): Low Risk (07/05/2024)   Depression (PHQ2-9)    PHQ-2 Score: 0  Alcohol Screen: Not on file  Housing: Low Risk (07/01/2024)   Epic    Unable to Pay for Housing in the Last Year: No    Number of Times Moved in the Last Year: 0    Homeless in the Last Year: No  Utilities: Not on file  Health Literacy: Not on file   Family History  Problem Relation Age of Onset   Hypertension Mother    Hypertension Sister    Heart attack Maternal Grandmother    Heart attack Maternal Grandfather      ROS: A review of systems was performed and is negative unless stated above in HPI    OBJECTIVE:    Constitutional:   The patient is alert and oriented x 3, appears to be stated age and in no distress.   Orthopaedic Examination:   Right knee:  Skin is intact about the right knee.  No erythema, ecchymosis, effusion noted.  Nontender about the medial and lateral joint lines.  Active and passive knee range of motion 0-125 degrees.       ASSESSMENT:  Right knee osteoarthritis - Euflexxa injection # 3 of 3 in office today     PLAN:   Patient was seen in office today for Euflexxa injection #3 of 3.  Patient tolerated injections #1 and #2 very well without concerns.  He does admit to 50% improvement in his symptoms following the first 2 injections.  Risks and benefits of injection were reviewed with patient again today.  He acknowledges and would like to proceed with Euflexxa injection #3.  Injection was provided in office.  Patient tolerated procedure well.  We discussed transition of care today. He will plan on following up with Dr. Mariah in 3 months to review effectiveness of viscosupplementation injections. Cautioned patient if he were develop significant increase in pain, swelling, redness and associated fever or  chills in the interim to notify the office right away or go to the emergency department for evaluation.   Follow-up in 3 months with Dr. Mariah  All questions and concerns were answered to the best of my ability.    Right knee Euflexxa injection procedure: The risks and benefits of a viscosupplementation injection were discussed and the patient wishes to proceed.  The anterolateral compartment of the right knee was cleansed with alcohol swabs and ChloraPrep.  The skin was flash cooled with Ethyl Chloride, and using sterile technique, a 21 gauge needle was immediately introduced into the joint.  After aspiration revealed a flash of clear yellow tinged joint fluid the injectate which consisted of 2 cc of Euflexxa easily flowed into the joint.  The needle was withdrawn and pressure was applied for hemostasis.  A bandage was applied.  The patient tolerated the procedure well. Patient instructed to ice the area tonight if sore.  General reactions: A flare reaction. This generally occurs 6-8 hours after receiving the injection.  Ice the area and take Tylenol  for pain.  If the pain lasts longer than 48 hours call the office.   Some patients may experience flushing, increased heart rate, red face or increase in body temperature.  This is rare but can happen.   Over the counter Benadryl, if appropriate, often reduces these symptoms.  For a severe reaction contact your family doctor or go to the emergency room.  If you have any other questions, feel free to ask.   Large Joint Inj: R knee on 09/03/2024 11:11 AM Indications: pain Details: (21 G) 1.5 in needle, anteromedial approach Medications: 20 mg Sodium Hyaluronate (Viscosup) 20 MG/2ML Outcome: tolerated well, no immediate complications Procedure, treatment alternatives, risks and benefits explained, specific risks discussed. Patient was prepped and draped in the usual sterile fashion.      I discussed with the patient today that I will be  transitioning out of my role within the near future. In order to provide appropriate continuity of care, we offered the patient options for follow up regarding their orthopedic concerns. Patient has chosen to follow up with Dr. Mariah at [place].  Patient may reach out to our office if there are any difficulties in scheduling follow up care. The patient understands who to contact for future orthopedic concerns and has contact information for the receiving practice.   Arlyss Schneider, DO Orthopedic Surgery & Sports Medicine [facility]      [1]  Current Outpatient Medications:    celecoxib  (CELEBREX ) 200 MG capsule, TAKE 1 CAPSULE(200 MG) BY MOUTH TWICE DAILY, Disp: 60 capsule, Rfl: 0   sertraline  (ZOLOFT ) 100 MG tablet, Take 1 tablet (100 mg total) by mouth daily., Disp: 90 tablet, Rfl: 3 [2] No Known Allergies

## 2024-10-04 ENCOUNTER — Other Ambulatory Visit: Payer: Self-pay | Admitting: Physician Assistant

## 2024-10-04 DIAGNOSIS — M25569 Pain in unspecified knee: Secondary | ICD-10-CM
